# Patient Record
Sex: Female | Born: 1980 | Race: Black or African American | Hispanic: No | Marital: Single | Smoking: Current every day smoker
Health system: Southern US, Community
[De-identification: ages and names within clinical notes are randomized; demographics above are authoritative.]

## PROBLEM LIST (undated history)

## (undated) DIAGNOSIS — R7303 Prediabetes: Secondary | ICD-10-CM

## (undated) DIAGNOSIS — D649 Anemia, unspecified: Secondary | ICD-10-CM

## (undated) DIAGNOSIS — D509 Iron deficiency anemia, unspecified: Secondary | ICD-10-CM

## (undated) DIAGNOSIS — I10 Essential (primary) hypertension: Secondary | ICD-10-CM

## (undated) DIAGNOSIS — J45909 Unspecified asthma, uncomplicated: Secondary | ICD-10-CM

---

## 1999-06-14 HISTORY — PX: ECTOPIC PREGNANCY SURGERY: SHX613

## 2019-03-19 ENCOUNTER — Ambulatory Visit: Payer: Self-pay

## 2019-03-22 ENCOUNTER — Ambulatory Visit: Payer: Self-pay

## 2019-04-25 ENCOUNTER — Encounter: Payer: Self-pay | Admitting: Emergency Medicine

## 2019-04-25 ENCOUNTER — Emergency Department: Payer: Medicaid - Out of State

## 2019-04-25 ENCOUNTER — Other Ambulatory Visit: Payer: Self-pay

## 2019-04-25 ENCOUNTER — Emergency Department
Admission: EM | Admit: 2019-04-25 | Discharge: 2019-04-25 | Disposition: A | Discharge status: Home / Self Care | Payer: Medicaid - Out of State | Attending: Emergency Medicine | Admitting: Emergency Medicine

## 2019-04-25 DIAGNOSIS — I10 Essential (primary) hypertension: Secondary | ICD-10-CM | POA: Diagnosis not present

## 2019-04-25 DIAGNOSIS — J45901 Unspecified asthma with (acute) exacerbation: Secondary | ICD-10-CM | POA: Insufficient documentation

## 2019-04-25 DIAGNOSIS — R079 Chest pain, unspecified: Secondary | ICD-10-CM | POA: Diagnosis present

## 2019-04-25 HISTORY — DX: Essential (primary) hypertension: I10

## 2019-04-25 LAB — CBC
HCT: 33.8 % — ABNORMAL LOW (ref 36.0–46.0)
Hemoglobin: 10.7 g/dL — ABNORMAL LOW (ref 12.0–15.0)
MCH: 24.9 pg — ABNORMAL LOW (ref 26.0–34.0)
MCHC: 31.7 g/dL (ref 30.0–36.0)
MCV: 78.8 fL — ABNORMAL LOW (ref 80.0–100.0)
Platelets: 241 10*3/uL (ref 150–400)
RBC: 4.29 MIL/uL (ref 3.87–5.11)
RDW: 18.2 % — ABNORMAL HIGH (ref 11.5–15.5)
WBC: 6.4 10*3/uL (ref 4.0–10.5)
nRBC: 0 % (ref 0.0–0.2)

## 2019-04-25 LAB — BASIC METABOLIC PANEL
Anion gap: 10 (ref 5–15)
BUN: 9 mg/dL (ref 6–20)
CO2: 21 mmol/L — ABNORMAL LOW (ref 22–32)
Calcium: 9.2 mg/dL (ref 8.9–10.3)
Chloride: 106 mmol/L (ref 98–111)
Creatinine, Ser: 0.54 mg/dL (ref 0.44–1.00)
GFR calc Af Amer: >60 mL/min (ref >60)
GFR calc non Af Amer: >60 mL/min (ref >60)
Glucose, Bld: 94 mg/dL (ref 70–99)
Potassium: 3.9 mmol/L (ref 3.5–5.1)
Sodium: 137 mmol/L (ref 135–145)

## 2019-04-25 LAB — TROPONIN I (HIGH SENSITIVITY)
Troponin I (High Sensitivity): 3 ng/L (ref <18)
Troponin I (High Sensitivity): 3 ng/L (ref <18)

## 2019-04-25 MED ORDER — ALBUTEROL SULFATE HFA 108 (90 BASE) MCG/ACT IN AERS: 2.0000 | INHALATION_SPRAY | Freq: Four times a day (QID) | RESPIRATORY_TRACT | 2 refills | Status: DC | PRN

## 2019-04-25 MED ORDER — PREDNISONE 20 MG PO TABS: 40.0000 mg | ORAL_TABLET | Freq: Every day | ORAL | 0 refills | Status: DC

## 2019-04-25 MED ORDER — ASPIRIN 81 MG PO CHEW
324.0000 mg | CHEWABLE_TABLET | Freq: Once | ORAL | Status: AC
  Administered 2019-04-25: 324 mg via ORAL
  Filled 2019-04-25: qty 4

## 2019-04-25 MED ORDER — PREDNISONE 20 MG PO TABS
40.0000 mg | ORAL_TABLET | Freq: Once | ORAL | Status: AC
  Administered 2019-04-25: 40 mg via ORAL
  Filled 2019-04-25: qty 2

## 2019-04-25 MED ORDER — IPRATROPIUM-ALBUTEROL 0.5-2.5 (3) MG/3ML IN SOLN
3.0000 mL | Freq: Once | RESPIRATORY_TRACT | Status: AC
  Administered 2019-04-25: 3 mL via RESPIRATORY_TRACT
  Filled 2019-04-25: qty 3

## 2019-04-25 NOTE — Discharge Instructions (Signed)
We believe that your symptoms are caused today by an exacerbation of your asthma.  Please take the prescribed medications and any medications that you have at home.  Follow up with your doctor as recommended.  If you develop any new or worsening symptoms, including but not limited to fever, persistent vomiting, worsening shortness of breath, or other symptoms that concern you, please return to the Emergency Department immediately.  

## 2019-04-25 NOTE — ED Provider Notes (Signed)
[facility] Emergency Department Provider Note   ____________________________________________   First MD Initiated Contact with Patient 04/25/19 1006     (approximate)  I have reviewed the triage vital signs and the nursing notes.   HISTORY  Chief Complaint Chest Pain and Shortness of Breath    HPI Margaret Newman is a 38 y.o. female is a history of asthma as well as hypertension  Patient reports that last night she began experiencing a slight wheeze or tight feeling in her chest.  She felt like it was her asthma.  She used her inhaler and this provided relief.  This morning when she woke up again she was again experiencing a feeling of tightness in the chest.  Used her inhaler and this again provided relief, but she was experiencing a tight feeling as well prompting her to come for evaluation.  Denies history of heart disease.  Has hypertension.  She does smoke.  No history of blood clots.  No recent long travels, leg swelling, recent surgeries or trauma.  Overall feeling better, just slight feeling of tightness in the chest.    Past Medical History:  Diagnosis Date  . Hypertension     There are no active problems to display for this patient.   History reviewed. No pertinent surgical history.  Prior to Admission medications   Medication Sig Start Date End Date Taking? Authorizing Provider  albuterol (VENTOLIN HFA) 108 (90 Base) MCG/ACT inhaler Inhale 2 puffs into the lungs every 6 (six) hours as needed for wheezing or shortness of breath. 04/25/19   Delman Kitten, MD  predniSONE (DELTASONE) 20 MG tablet Take 2 tablets (40 mg total) by mouth daily with breakfast. 04/25/19   Delman Kitten, MD    Allergies Patient has no allergy information on record.  No family history on file.  Social History Social History   Tobacco Use  . Smoking status: Not on file  Substance Use Topics  . Alcohol use: Not on file  . Drug use: Not on file  Smoker,  occasional marijuana use.  No alcohol use  Review of Systems Constitutional: No fever/chills Eyes: No visual changes. ENT: No sore throat. Cardiovascular: Denies chest pain.  Ports a tightness feeling in the chest very light, better now but still some persistence left chest.  Nonradiating. Respiratory: Denies shortness of breath except had a little bit last night relieved by use of her inhaler and also some this morning again relieved by use of inhaler. Gastrointestinal: No abdominal pain.   Genitourinary: Negative for dysuria. Musculoskeletal: Negative for back pain. Skin: Negative for rash. Neurological: Negative for headaches, areas of focal weakness or numbness.    ____________________________________________   PHYSICAL EXAM:  VITAL SIGNS: ED Triage Vitals  Enc Vitals Group     BP 04/25/19 0941 (!) 160/103     Pulse Rate 04/25/19 0941 72     Resp 04/25/19 0941 20     Temp 04/25/19 0941 98.6 F (37 C)     Temp Source 04/25/19 0941 Oral     SpO2 04/25/19 0941 97 %     Weight 04/25/19 0939 190 lb (86.2 kg)     Height 04/25/19 0939 5\' 2"  (1.575 m)     Head Circumference --      Peak Flow --      Pain Score 04/25/19 0939 6     Pain Loc --      Pain Edu? --      Excl. in [place]? --  Constitutional: Alert and oriented. Well appearing and in no acute distress. Eyes: Conjunctivae are normal. Head: Atraumatic. Nose: No congestion/rhinnorhea. Mouth/Throat: Mucous membranes are moist. Neck: No stridor.  Cardiovascular: Normal rate, regular rhythm. Grossly normal heart sounds.  Good peripheral circulation. Respiratory: Normal respiratory effort.  No retractions. Lungs CTAB for very minimal slight end expiratory wheeze but speaking in full and clear sentences with normal respiratory pattern. Gastrointestinal: Soft and nontender. No distention. Musculoskeletal: No lower extremity tenderness nor edema. Neurologic:  Normal speech and language. No gross focal neurologic deficits  are appreciated.  Skin:  Skin is warm, dry and intact. No rash noted. Psychiatric: Mood and affect are normal. Speech and behavior are normal.  ____________________________________________   LABS (all labs ordered are listed, but only abnormal results are displayed)  Labs Reviewed  BASIC METABOLIC PANEL - Abnormal; Notable for the following components:      Result Value   CO2 21 (*)    All other components within normal limits  CBC - Abnormal; Notable for the following components:   Hemoglobin 10.7 (*)    HCT 33.8 (*)    MCV 78.8 (*)    MCH 24.9 (*)    RDW 18.2 (*)    All other components within normal limits  TROPONIN I (HIGH SENSITIVITY)  TROPONIN I (HIGH SENSITIVITY)   ____________________________________________  EKG  Reviewed enterotomy at 950 Heart rate 70 QRS 110 QTc 430 Normal sinus rhythm, no evidence of ischemia or ectopy.  Normal EKG ____________________________________________  RADIOLOGY  Chest x-ray viewed by me, normal exam ____________________________________________   PROCEDURES  Procedure(s) performed: None  Procedures  Critical Care performed: No  ____________________________________________   INITIAL IMPRESSION / ASSESSMENT AND PLAN / ED COURSE  Pertinent labs & imaging results that were available during my care of the patient were reviewed by me and considered in my medical decision making (see chart for details).   Differential diagnosis includes, but is not limited to, ACS, aortic dissection, pulmonary embolism, cardiac tamponade, pneumothorax, pneumonia, pericarditis, myocarditis, GI-related causes including esophagitis/gastritis, and musculoskeletal chest wall pain.  Patient does not report clinical symptoms of infection.  I suspect her symptoms of chest tightness are most likely related to asthma exacerbation which at the present time appears to be mild, but she has reported responding well to use of her home albuterol.  She is  currently on treatment for hypertension.  She is sinus shows no signs of heart failure.  Felt low risk for ACS based on clinical history and exam, minimal risk factors.  Will check heart score   No signs or symptoms of DVT PE or dissection.      Pulmonary Embolism Rule-out Criteria (PERC rule)                        If YES to ANY of the following, the PERC rule is not satisfied and cannot be used to rule out PE in this patient (consider d-dimer or imaging depending on pre-test probability).                      If NO to ALL of the following, AND the clinician's pre-test probability is <15%, the [facility] rule is satisfied and there is no need for further workup (including no need to obtain a d-dimer) as the post-test probability of pulmonary embolism is <2%.  Mnemonic is HAD CLOTS   H - hormone use (exogenous estrogen)      No. A - age > 50                                                 No. D - DVT/PE history                                      No.   C - coughing blood (hemoptysis)                 No. L - leg swelling, unilateral                             No. O - O2 Sat on Room Air < 95%                  No. T - tachycardia (HR ? 100)                         No. S - surgery or trauma, recent                      No.   Based on my evaluation of the patient, including application of this decision instrument, further testing to evaluate for pulmonary embolism is not indicated at this time.     Clinical Course as of Apr 24 1254  Thu Apr 25, 2019  1117 Hemoglobin noted to be low, low MCV.  Patient denies any acute signs or symptoms suggest bleeding or hemorrhage.   [MQ]  1118 Last menstrual cycle was a couple weeks ago, normal.  Denies pregnancy.   [MQ]    Clinical Course User Index [MQ] Delman Kitten, MD    HEART score 2, low risk  ----------------------------------------- 12:55 PM on 04/25/2019 -----------------------------------------  Patient reports he  feels much better.  Resting comfortably.  Suspect likely mild asthma.  Currently asymptomatic well-appearing.  Return precautions and treatment recommendations and follow-up discussed with the patient who is agreeable with the plan.  ____________________________________________   FINAL CLINICAL IMPRESSION(S) / ED DIAGNOSES  Final diagnoses:  Mild asthma exacerbation        Note:  This document was prepared using Dragon voice recognition software and may include unintentional dictation errors       Delman Kitten, MD 04/25/19 1256

## 2019-04-25 NOTE — ED Notes (Signed)
Pt states that last night she was woken up out of her sleep with an asthma attack. States that she took her inhaler and found no relief. States that she woke up this morning with chest tightness and SOB.

## 2019-04-25 NOTE — ED Triage Notes (Signed)
Pt reports awoke this am and couldn't breathe so she used her inhaler for asthma and has had some tightness in her chest since.

## 2019-04-25 NOTE — ED Notes (Signed)
Patient ambulatory to lobby with steady gait and NAD noted. Verbalized understanding of discharge instructions and follow-up care.  

## 2019-04-25 NOTE — ED Notes (Signed)
Patient transported to x-ray. ?

## 2019-05-08 ENCOUNTER — Other Ambulatory Visit: Payer: Self-pay

## 2019-05-08 ENCOUNTER — Ambulatory Visit: Payer: Self-pay | Admitting: Physician Assistant

## 2019-05-08 DIAGNOSIS — I10 Essential (primary) hypertension: Secondary | ICD-10-CM

## 2019-05-08 DIAGNOSIS — Z113 Encounter for screening for infections with a predominantly sexual mode of transmission: Secondary | ICD-10-CM

## 2019-05-08 DIAGNOSIS — Z299 Encounter for prophylactic measures, unspecified: Secondary | ICD-10-CM

## 2019-05-08 MED ORDER — METRONIDAZOLE 500 MG PO TABS: 2000.0000 mg | ORAL_TABLET | Freq: Once | ORAL | 0 refills | Status: AC

## 2019-05-08 NOTE — Progress Notes (Signed)
Wet mount reviewed with provider, and is negative today. Pt received Metronidazole 2g per Antoine Primas, PA verbal order. Provider orders completed.Ronny Bacon, RN

## 2019-05-11 ENCOUNTER — Encounter: Payer: Self-pay | Admitting: Physician Assistant

## 2019-05-11 DIAGNOSIS — I10 Essential (primary) hypertension: Secondary | ICD-10-CM | POA: Insufficient documentation

## 2019-05-11 NOTE — Progress Notes (Signed)
STI clinic/screening visit  Subjective:  Margaret Newman is a 38 y.o. female being seen today for an STI screening visit. The patient reports they do have symptoms.    Patient has the following medical conditions:  There are no active problems to display for this patient.    Chief Complaint  Patient presents with  . SEXUALLY TRANSMITTED DISEASE    HPI  Patient reports that she has had a "fishy" odor for about 1 week and some cramping after urination.  Denies other symptoms.  LMP 04/27/2019 and normal.  History of HTN and surgery for ectopic pregnancy 19 years ago.  See flowsheet for further details and programmatic requirements.    The following portions of the patient's history were reviewed and updated as appropriate: allergies, current medications, past medical history, past social history, past surgical history and problem list.  Objective:  There were no vitals filed for this visit.  Physical Exam Constitutional:      General: She is not in acute distress.    Appearance: Normal appearance.  HENT:     Head: Normocephalic and atraumatic.     Comments: No nits, lice, or hair loss. No cervical, supraclavicular or axillary adenopathy.    Mouth/Throat:     Mouth: Mucous membranes are moist.     Pharynx: Oropharynx is clear. No oropharyngeal exudate or posterior oropharyngeal erythema.  Eyes:     Conjunctiva/sclera: Conjunctivae normal.  Neck:     Musculoskeletal: Neck supple.  Pulmonary:     Effort: Pulmonary effort is normal.  Abdominal:     Palpations: Abdomen is soft. There is no mass.     Tenderness: There is no abdominal tenderness. There is no guarding or rebound.  Genitourinary:    General: Normal vulva.     Rectum: Normal.     Comments: External genitalia/pubic area without nits, lice, edema, erythema, and inguinal adenopathy. Vagina with normal mucosa and small amount of clear discharge, pH=4.5. Cervix without visible lesions. Uterus firm, mobile,  nt, no masses, no adnexal tenderness or fullness, no CMT. Lymphadenopathy:     Cervical: No cervical adenopathy.  Skin:    General: Skin is warm and dry.     Findings: No bruising, erythema, lesion or rash.  Neurological:     Mental Status: She is alert and oriented to person, place, and time.  Psychiatric:        Mood and Affect: Mood normal.        Behavior: Behavior normal.        Thought Content: Thought content normal.        Judgment: Judgment normal.       Assessment and Plan:  Margaret Newman is a 38 y.o. female presenting to the [facility] Department for STI screening  1. Screening for STD (sexually transmitted disease) Patient into clinic with symptoms.   Rec condoms with all sex. Await test results.  Counseled that RN will call if needs to RTC for further treatment once results are back. - WET PREP FOR TRICH, YEAST, CLUE - Gonococcus culture - Chlamydia/Gonorrhea [place] [facility] - HIV/HCV [place] [facility] - Syphilis Serology, [place] [facility] - Gonococcus culture  2. Prophylactic measure Will give metronidazole 2 g po with food, no EtOH for 24 hr before and until 72 hr after taking medicine due to symptoms. No sex for 7 days. Enc to use OTC antifungal cream if has itching after taking antibiotic. - metroNIDAZOLE (FLAGYL) 500 MG tablet; Take 4 tablets (2,000  mg total) by mouth once for 1 dose.  Dispense: 4 tablet; Refill: 0     Return in about 3 weeks (around 05/29/2019) for test results.  No future appointments.  Jerene Dilling, PA

## 2019-05-13 LAB — GONOCOCCUS CULTURE

## 2019-05-17 LAB — HM HEPATITIS C SCREENING LAB: HM Hepatitis Screen: NEGATIVE

## 2019-05-17 LAB — HM HIV SCREENING LAB: HM HIV Screening: NEGATIVE

## 2019-05-22 LAB — WET PREP FOR TRICH, YEAST, CLUE
Trichomonas Exam: NEGATIVE
Yeast Exam: NEGATIVE

## 2019-06-09 ENCOUNTER — Other Ambulatory Visit: Payer: Self-pay

## 2019-06-09 ENCOUNTER — Encounter: Payer: Self-pay | Admitting: Emergency Medicine

## 2019-06-09 ENCOUNTER — Emergency Department
Admission: EM | Admit: 2019-06-09 | Discharge: 2019-06-09 | Disposition: A | Discharge status: Home / Self Care | Payer: PRIVATE HEALTH INSURANCE | Attending: Emergency Medicine | Admitting: Emergency Medicine

## 2019-06-09 DIAGNOSIS — L0291 Cutaneous abscess, unspecified: Secondary | ICD-10-CM

## 2019-06-09 DIAGNOSIS — L0201 Cutaneous abscess of face: Secondary | ICD-10-CM | POA: Insufficient documentation

## 2019-06-09 DIAGNOSIS — Z79899 Other long term (current) drug therapy: Secondary | ICD-10-CM | POA: Insufficient documentation

## 2019-06-09 DIAGNOSIS — F17210 Nicotine dependence, cigarettes, uncomplicated: Secondary | ICD-10-CM | POA: Insufficient documentation

## 2019-06-09 DIAGNOSIS — I10 Essential (primary) hypertension: Secondary | ICD-10-CM | POA: Insufficient documentation

## 2019-06-09 MED ORDER — LIDOCAINE-EPINEPHRINE-TETRACAINE (LET) TOPICAL GEL
3.0000 mL | Freq: Once | TOPICAL | Status: AC
  Administered 2019-06-09: 3 mL via TOPICAL

## 2019-06-09 MED ORDER — CEPHALEXIN 500 MG PO CAPS: 500.0000 mg | ORAL_CAPSULE | Freq: Three times a day (TID) | ORAL | 0 refills | Status: AC

## 2019-06-09 NOTE — ED Triage Notes (Signed)
abscess to left temple x 2 weeks.

## 2019-06-09 NOTE — ED Notes (Signed)
Pt presents to the ED for a small abcess, approx the size of a marble, on her L eyebrow. Pt states that it is not painful anymore and she has been trying to apply warm compresses on it to help drain it.

## 2019-06-09 NOTE — ED Provider Notes (Signed)
[facility] Emergency Department Provider Note  ____________________________________________   First MD Initiated Contact with Patient 06/09/19 1003     (approximate)  I have reviewed the triage vital signs and the nursing notes.   HISTORY  Chief Complaint Abscess    HPI Margaret Newman is a 38 y.o. female presents emergency department complaint of small abscess adjacent to the left brow.  States area is been there for approximately 2 weeks.  No fever or chills.  States it is finally come to a head and would like for [place] to open it up.    Past Medical History:  Diagnosis Date  . Hypertension     Patient Active Problem List   Diagnosis Date Noted  . Hypertension 05/11/2019    Past Surgical History:  Procedure Laterality Date  . ECTOPIC PREGNANCY SURGERY  2001    Prior to Admission medications   Medication Sig Start Date End Date Taking? Authorizing Provider  albuterol (VENTOLIN HFA) 108 (90 Base) MCG/ACT inhaler Inhale 2 puffs into the lungs every 6 (six) hours as needed for wheezing or shortness of breath. 04/25/19   Delman Kitten, MD  AMLODIPINE-ATORVASTATIN PO Take by mouth.    [provider]  cephALEXin (KEFLEX) 500 MG capsule Take 1 capsule (500 mg total) by mouth 3 (three) times daily for 10 days. 06/09/19 06/19/19  Anvith Mauriello, Linden Dolin, PA-C  predniSONE (DELTASONE) 20 MG tablet Take 2 tablets (40 mg total) by mouth daily with breakfast. 04/25/19   Delman Kitten, MD    Allergies Patient has no known allergies.  History reviewed. No pertinent family history.  Social History Social History   Tobacco Use  . Smoking status: Current Every Day Smoker  . Smokeless tobacco: Never Used  Substance Use Topics  . Alcohol use: Yes    Comment: occasionally  . Drug use: Not Currently    Types: Marijuana    Comment: used in high school.    Review of Systems  Constitutional: No fever/chills Eyes: No visual changes. ENT: No sore  throat. Respiratory: Denies cough Genitourinary: Negative for dysuria. Musculoskeletal: Negative for back pain. Skin: Negative for rash.  Positive for cystlike abscess on the left temple    ____________________________________________   PHYSICAL EXAM:  VITAL SIGNS: ED Triage Vitals  Enc Vitals Group     BP 06/09/19 0926 (!) 156/92     Pulse Rate 06/09/19 0926 79     Resp 06/09/19 0926 16     Temp 06/09/19 0926 98.4 F (36.9 C)     Temp Source 06/09/19 0926 Oral     SpO2 06/09/19 0926 98 %     Weight 06/09/19 0923 190 lb 0.6 oz (86.2 kg)     Height --      Head Circumference --      Peak Flow --      Pain Score 06/09/19 0922 9     Pain Loc --      Pain Edu? --      Excl. in [place]? --     Constitutional: Alert and oriented. Well appearing and in no acute distress. Eyes: Conjunctivae are normal.  Head: Atraumatic.  Cystlike abscess to the left temple Nose: No congestion/rhinnorhea. Mouth/Throat: Mucous membranes are moist.   Neck:  supple no lymphadenopathy noted Cardiovascular: Normal rate, regular rhythm. Respiratory: Normal respiratory effort.  No retractions,  GU: deferred Musculoskeletal: FROM all extremities, warm and well perfused Neurologic:  Normal speech and language.  Skin:  Skin is warm, dry  and intact. No rash noted.  Small cystlike abscess noted to left temple Psychiatric: Mood and affect are normal. Speech and behavior are normal.  ____________________________________________   LABS (all labs ordered are listed, but only abnormal results are displayed)  Labs Reviewed - No data to display ____________________________________________   ____________________________________________  RADIOLOGY    ____________________________________________   PROCEDURES  Procedure(s) performed: Permission for procedure by the patient verbally, L ET applied to the abscess, Betadine to clean the area, number 18-gauge needle used to make a small incision, pus was  expelled, patient tolerated procedure well  Procedures    ____________________________________________   INITIAL IMPRESSION / ASSESSMENT AND PLAN / ED COURSE  Pertinent labs & imaging results that were available during my care of the patient were reviewed by me and considered in my medical decision making (see chart for details).   Patient is 38 year old female presents emergency department with concerns of an abscess to the left temple.  See HPI  Physical exam shows small cystlike abscess noted.  See procedure note for incision/drainage  Patient was given a prescription for Keflex 500 3 times daily for 7 days.  Follow-up with your regular doctor return emergency department worsening.  She is discharged in stable condition.    Margaret Newman was evaluated in Emergency Department on 06/09/2019 for the symptoms described in the history of present illness. She was evaluated in the context of the global COVID-19 pandemic, which necessitated consideration that the patient might be at risk for infection with the SARS-CoV-2 virus that causes COVID-19. Institutional protocols and algorithms that pertain to the evaluation of patients at risk for COVID-19 are in a state of rapid change based on information released by regulatory bodies including the CDC and federal and state organizations. These policies and algorithms were followed during the patient's care in the ED.   As part of my medical decision making, I reviewed the following data within the [place] notes reviewed and incorporated, Old chart reviewed, Notes from prior ED visits and [place] Controlled Substance Database  ____________________________________________   FINAL CLINICAL IMPRESSION(S) / ED DIAGNOSES  Final diagnoses:  Abscess      NEW MEDICATIONS STARTED DURING THIS VISIT:  New Prescriptions   CEPHALEXIN (KEFLEX) 500 MG CAPSULE    Take 1 capsule (500 mg total) by mouth 3 (three) times daily for  10 days.     Note:  This document was prepared using Dragon voice recognition software and may include unintentional dictation errors.    Versie Starks, PA-C 06/09/19 1057    Harvest Dark, MD 06/09/19 1345

## 2019-07-12 ENCOUNTER — Ambulatory Visit: Payer: Self-pay

## 2019-08-24 ENCOUNTER — Other Ambulatory Visit: Payer: Self-pay

## 2019-08-24 ENCOUNTER — Encounter: Payer: Self-pay | Admitting: Emergency Medicine

## 2019-08-24 ENCOUNTER — Emergency Department
Admission: EM | Admit: 2019-08-24 | Discharge: 2019-08-24 | Disposition: A | Discharge status: Home / Self Care | Payer: PRIVATE HEALTH INSURANCE | Attending: Emergency Medicine | Admitting: Emergency Medicine

## 2019-08-24 DIAGNOSIS — B9689 Other specified bacterial agents as the cause of diseases classified elsewhere: Secondary | ICD-10-CM

## 2019-08-24 DIAGNOSIS — R1030 Lower abdominal pain, unspecified: Secondary | ICD-10-CM

## 2019-08-24 DIAGNOSIS — I10 Essential (primary) hypertension: Secondary | ICD-10-CM | POA: Insufficient documentation

## 2019-08-24 DIAGNOSIS — N760 Acute vaginitis: Secondary | ICD-10-CM | POA: Insufficient documentation

## 2019-08-24 DIAGNOSIS — F17200 Nicotine dependence, unspecified, uncomplicated: Secondary | ICD-10-CM | POA: Insufficient documentation

## 2019-08-24 LAB — URINALYSIS, COMPLETE (UACMP) WITH MICROSCOPIC
Bacteria, UA: NONE SEEN
Bilirubin Urine: NEGATIVE
Glucose, UA: NEGATIVE mg/dL
Hgb urine dipstick: NEGATIVE
Ketones, ur: NEGATIVE mg/dL
Leukocytes,Ua: NEGATIVE
Nitrite: NEGATIVE
Protein, ur: NEGATIVE mg/dL
Specific Gravity, Urine: 1.016 (ref 1.005–1.030)
pH: 6 (ref 5.0–8.0)

## 2019-08-24 LAB — WET PREP, GENITAL
Clue Cells Wet Prep HPF POC: NONE SEEN
Sperm: NONE SEEN
Trich, Wet Prep: NONE SEEN
Yeast Wet Prep HPF POC: NONE SEEN

## 2019-08-24 LAB — CHLAMYDIA/NGC RT PCR (ARMC ONLY)
Chlamydia Tr: NOT DETECTED
N gonorrhoeae: NOT DETECTED

## 2019-08-24 LAB — POCT PREGNANCY, URINE: Preg Test, Ur: NEGATIVE

## 2019-08-24 MED ORDER — METRONIDAZOLE 500 MG PO TABS: 500.0000 mg | ORAL_TABLET | Freq: Two times a day (BID) | ORAL | 0 refills | Status: DC

## 2019-08-24 MED ORDER — FLUCONAZOLE 150 MG PO TABS: 150.0000 mg | ORAL_TABLET | Freq: Every day | ORAL | 0 refills | Status: DC

## 2019-08-24 NOTE — ED Notes (Signed)
Pt speaking with this RN in NAD, reports RLQ pain that felt like gas starting last week but pain is now center lower abdomen accompanied by a foul vaginal odor and white vaginal discharge. No dysuria. A&Ox4.

## 2019-08-24 NOTE — ED Provider Notes (Signed)
[facility] Emergency Department Provider Note   ____________________________________________    I have reviewed the triage vital signs and the nursing notes.   HISTORY  Chief Complaint Abdominal Pain     HPI Margaret Newman is a 39 y.o. female who presents with complaints of lower abdominal pain.  Patient describes that she had some discomfort in her right lower quadrant which has primarily resolved.  This morning after urinating she had cramping suprapubically which was new.  She denies fevers or chills.  Currently she feels overall well.  Denies nausea or vomiting.  Has not take anything for this.  Past Medical History:  Diagnosis Date  . Hypertension     Patient Active Problem List   Diagnosis Date Noted  . Hypertension 05/11/2019    Past Surgical History:  Procedure Laterality Date  . ECTOPIC PREGNANCY SURGERY  2001    Prior to Admission medications   Medication Sig Start Date End Date Taking? Authorizing Provider  AMLODIPINE-ATORVASTATIN PO Take by mouth.   Yes [provider]  fluconazole (DIFLUCAN) 150 MG tablet Take 1 tablet (150 mg total) by mouth daily. If no improvement after first dose can take 2nd tablet 72 hours later 08/24/19   Lavonia Drafts, MD  metroNIDAZOLE (FLAGYL) 500 MG tablet Take 1 tablet (500 mg total) by mouth 2 (two) times daily after a meal. 08/24/19   Lavonia Drafts, MD     Allergies Patient has no known allergies.  History reviewed. No pertinent family history.  Social History Social History   Tobacco Use  . Smoking status: Current Every Day Smoker  . Smokeless tobacco: Never Used  Substance Use Topics  . Alcohol use: Yes    Comment: occasionally  . Drug use: Not Currently    Types: Marijuana    Comment: used in high school.    Review of Systems  Constitutional: No fever/chills Eyes: No visual changes.  ENT: No sore throat. Cardiovascular: Denies chest pain. Respiratory: Denies  shortness of breath. Gastrointestinal: As above Genitourinary: As above, no vaginal discharge Musculoskeletal: Negative for back pain. Skin: Negative for rash. Neurological: Negative for headaches or weakness   ____________________________________________   PHYSICAL EXAM:  VITAL SIGNS: ED Triage Vitals  Enc Vitals Group     BP 08/24/19 0837 134/85     Pulse Rate 08/24/19 0837 74     Resp 08/24/19 0837 18     Temp 08/24/19 0837 99.2 F (37.3 C)     Temp Source 08/24/19 0837 Oral     SpO2 08/24/19 0837 99 %     Weight 08/24/19 0827 86.2 kg (190 lb)     Height 08/24/19 0827 1.588 m (5' 2.5")     Head Circumference --      Peak Flow --      Pain Score 08/24/19 0827 7     Pain Loc --      Pain Edu? --      Excl. in [place]? --     Constitutional: Alert and oriented.  Eyes: Conjunctivae are normal.   Mouth/Throat: Mucous membranes are moist.   Neck:  Painless ROM Cardiovascular: Normal rate, regular rhythm Good peripheral circulation. Respiratory: Normal respiratory effort.  No retractions. Gastrointestinal: Soft and nontender. No distention.  No CVA tenderness.  Reassuring exam Genitourinary: No cervicitis, white discharge no CMT Musculoskeletal: No lower extremity tenderness nor edema.  Warm and well perfused Neurologic:  Normal speech and language. No gross focal neurologic deficits are appreciated.  Skin:  Skin is warm, dry and intact. No rash noted. Psychiatric: Mood and affect are normal. Speech and behavior are normal.  ____________________________________________   LABS (all labs ordered are listed, but only abnormal results are displayed)  Labs Reviewed  WET PREP, GENITAL - Abnormal; Notable for the following components:      Result Value   WBC, Wet Prep HPF POC FEW (*)    All other components within normal limits  URINALYSIS, COMPLETE (UACMP) WITH MICROSCOPIC - Abnormal; Notable for the following components:   Color, Urine YELLOW (*)    APPearance HAZY (*)      All other components within normal limits  CHLAMYDIA/NGC RT PCR ([facility] ONLY)  POCT PREGNANCY, URINE   ____________________________________________  EKG  None ____________________________________________  RADIOLOGY  None ____________________________________________   PROCEDURES  Procedure(s) performed: No  Procedures   Critical Care performed: No ____________________________________________   INITIAL IMPRESSION / ASSESSMENT AND PLAN / ED COURSE  Pertinent labs & imaging results that were available during my care of the patient were reviewed by me and considered in my medical decision making (see chart for details).  Patient well-appearing with unremarkable exam, suspicious for urinary tract infection given HPI, will check urinalysis, POCT urine  Urinalysis unremarkable, patient does admit to vaginal discharge with fishy odor.  Pelvic exam performed which demonstrates white discharge, sent for wet prep some white blood cells otherwise unremarkable.  Will cover with Flagyl for presumed bacterial vaginosis, outpatient follow-up with GYN if no improvement    ____________________________________________   FINAL CLINICAL IMPRESSION(S) / ED DIAGNOSES  Final diagnoses:  Lower abdominal pain  Bacterial vaginosis        Note:  This document was prepared using Dragon voice recognition software and may include unintentional dictation errors.   Lavonia Drafts, MD 08/24/19 1245

## 2019-08-24 NOTE — ED Triage Notes (Signed)
Pt ambulatory to triage with c/o abdominal pain starting 1 week ago.  Pt denies n/v, states urinary dysfunction.

## 2019-08-24 NOTE — ED Notes (Signed)
Pt also reports right ear feels full and would like this looked at

## 2019-09-19 ENCOUNTER — Other Ambulatory Visit: Payer: Self-pay

## 2019-09-19 ENCOUNTER — Emergency Department
Admission: EM | Admit: 2019-09-19 | Discharge: 2019-09-19 | Disposition: A | Discharge status: Home / Self Care | Payer: Medicaid - Out of State | Attending: Emergency Medicine | Admitting: Emergency Medicine

## 2019-09-19 ENCOUNTER — Emergency Department: Payer: Medicaid - Out of State

## 2019-09-19 ENCOUNTER — Encounter: Payer: Self-pay | Admitting: Emergency Medicine

## 2019-09-19 DIAGNOSIS — Y990 Civilian activity done for income or pay: Secondary | ICD-10-CM | POA: Insufficient documentation

## 2019-09-19 DIAGNOSIS — F17210 Nicotine dependence, cigarettes, uncomplicated: Secondary | ICD-10-CM | POA: Insufficient documentation

## 2019-09-19 DIAGNOSIS — Y9259 Other trade areas as the place of occurrence of the external cause: Secondary | ICD-10-CM | POA: Diagnosis not present

## 2019-09-19 DIAGNOSIS — S3992XA Unspecified injury of lower back, initial encounter: Secondary | ICD-10-CM | POA: Diagnosis present

## 2019-09-19 DIAGNOSIS — Y9389 Activity, other specified: Secondary | ICD-10-CM | POA: Diagnosis not present

## 2019-09-19 DIAGNOSIS — Z79899 Other long term (current) drug therapy: Secondary | ICD-10-CM | POA: Insufficient documentation

## 2019-09-19 DIAGNOSIS — I10 Essential (primary) hypertension: Secondary | ICD-10-CM | POA: Insufficient documentation

## 2019-09-19 DIAGNOSIS — X500XXA Overexertion from strenuous movement or load, initial encounter: Secondary | ICD-10-CM | POA: Diagnosis not present

## 2019-09-19 DIAGNOSIS — S39012A Strain of muscle, fascia and tendon of lower back, initial encounter: Secondary | ICD-10-CM | POA: Insufficient documentation

## 2019-09-19 LAB — URINALYSIS, COMPLETE (UACMP) WITH MICROSCOPIC
Bacteria, UA: NONE SEEN
Bilirubin Urine: NEGATIVE
Glucose, UA: NEGATIVE mg/dL
Ketones, ur: NEGATIVE mg/dL
Leukocytes,Ua: NEGATIVE
Nitrite: NEGATIVE
Protein, ur: NEGATIVE mg/dL
Specific Gravity, Urine: 1.016 (ref 1.005–1.030)
pH: 6 (ref 5.0–8.0)

## 2019-09-19 LAB — POCT PREGNANCY, URINE: Preg Test, Ur: NEGATIVE

## 2019-09-19 MED ORDER — LIDOCAINE 5 % EX PTCH: 1.0000 | MEDICATED_PATCH | CUTANEOUS | 0 refills | Status: DC

## 2019-09-19 MED ORDER — FLUCONAZOLE 100 MG PO TABS: 150.0000 mg | ORAL_TABLET | Freq: Every day | ORAL | 0 refills | Status: AC

## 2019-09-19 MED ORDER — IBUPROFEN 400 MG PO TABS: 400.0000 mg | ORAL_TABLET | Freq: Four times a day (QID) | ORAL | 0 refills | Status: DC | PRN

## 2019-09-19 MED ORDER — BACLOFEN 5 MG PO TABS: 5.0000 mg | ORAL_TABLET | Freq: Three times a day (TID) | ORAL | 0 refills | Status: DC | PRN

## 2019-09-19 NOTE — ED Notes (Signed)
See triage note  Presents with lower back pain  States bent down at work to lift something  Developed pain to left lower back  States this happened yesterday

## 2019-09-19 NOTE — ED Notes (Signed)
EDT in to complete required WC testing per company profile, pt refused to have testing completed. RN aware.

## 2019-09-19 NOTE — ED Triage Notes (Signed)
Pt reports pulled a muscle in her left lower back yesterday. Pt works for [employer] reports pain still there this am.

## 2019-09-19 NOTE — ED Provider Notes (Signed)
[facility] Emergency Department Provider Note  ____________________________________________  Time seen: Approximately 11:44 AM  I have reviewed the triage vital signs and the nursing notes.   HISTORY  Chief Complaint Back Pain    HPI Margaret Newman is a 39 y.o. female that presents to the emergency department for evaluation of left-sided mid back pain since yesterday.  Pain started while patient was at work and she was moving heavy machinery.  She did not have any specific injury doing this.  She did not feel a pop.  Pain has stayed to her left mid back since.  She woke up this morning and pain was still present so she came to the emergency department.  She noticed after arriving to the emergency department that her urine looked cloudy and she had a drop of blood when she wiped.  She is not sure of blood is from her urine or was spotting.  Last menstrual period was 2 weeks ago.  She has never had a kidney stone.  Patient used a new [employer] soap, [place]" and states that it has irritated her genitals a bit.  No fever, chills, nausea, vomiting, abdominal pain.   Past Medical History:  Diagnosis Date  . Hypertension     Patient Active Problem List   Diagnosis Date Noted  . Hypertension 05/11/2019    Past Surgical History:  Procedure Laterality Date  . ECTOPIC PREGNANCY SURGERY  2001    Prior to Admission medications   Medication Sig Start Date End Date Taking? Authorizing Provider  AMLODIPINE-ATORVASTATIN PO Take by mouth.    [provider]  Baclofen 5 MG TABS Take 5 mg by mouth 3 (three) times daily as needed. 09/19/19   Laban Emperor, PA-C  fluconazole (DIFLUCAN) 100 MG tablet Take 1.5 tablets (150 mg total) by mouth daily for 1 day. 09/19/19 09/20/19  Laban Emperor, PA-C  ibuprofen (ADVIL) 400 MG tablet Take 1 tablet (400 mg total) by mouth every 6 (six) hours as needed. 09/19/19   Laban Emperor, PA-C  lidocaine  (LIDODERM) 5 % Place 1 patch onto the skin daily. Remove & Discard patch within 12 hours or as directed by MD 09/19/19   Laban Emperor, PA-C    Allergies Patient has no known allergies.  No family history on file.  Social History Social History   Tobacco Use  . Smoking status: Current Every Day Smoker  . Smokeless tobacco: Never Used  Substance Use Topics  . Alcohol use: Yes    Comment: occasionally  . Drug use: Not Currently    Types: Marijuana    Comment: used in high school.     Review of Systems  Constitutional: No fever/chills Cardiovascular: No chest pain. Respiratory: No SOB. Gastrointestinal: No abdominal pain.  No nausea, no vomiting.  Genitourinary: Negative for dysuria.   Musculoskeletal: Positive for back pain. Skin: Negative for rash, abrasions, lacerations, ecchymosis. Neurological: Negative for headaches   ____________________________________________   PHYSICAL EXAM:  VITAL SIGNS: ED Triage Vitals  Enc Vitals Group     BP 09/19/19 1044 130/80     Pulse Rate 09/19/19 1044 78     Resp 09/19/19 1044 18     Temp 09/19/19 1044 98 F (36.7 C)     Temp Source 09/19/19 1044 Oral     SpO2 09/19/19 1044 98 %     Weight 09/19/19 1033 185 lb (83.9 kg)     Height 09/19/19 1033 5\' 2"  (1.575 m)  Head Circumference --      Peak Flow --      Pain Score 09/19/19 1032 7     Pain Loc --      Pain Edu? --      Excl. in [place]? --      Constitutional: Alert and oriented. Well appearing and in no acute distress.  On the telephone. Eyes: Conjunctivae are normal. PERRL. EOMI. Head: Atraumatic. ENT:      Ears:      Nose: No congestion/rhinnorhea.      Mouth/Throat: Mucous membranes are moist.  Neck: No stridor. Cardiovascular: Normal rate, regular rhythm.  Good peripheral circulation. Respiratory: Normal respiratory effort without tachypnea or retractions. Lungs CTAB. Good air entry to the bases with no decreased or absent breath sounds. Gastrointestinal:  Bowel sounds 4 quadrants. Soft and nontender to palpation. No guarding or rigidity. No palpable masses. No distention. No CVA tenderness. Musculoskeletal: Full range of motion to all extremities. No gross deformities appreciated.  No tenderness to palpation over lumbar spine.  Tenderness to palpation to left mid back and left lumbar paraspinal muscles.  Strength equal in lower extremities bilaterally.  Normal gait. Neurologic:  Normal speech and language. No gross focal neurologic deficits are appreciated.  Skin:  Skin is warm, dry and intact. No rash noted. Psychiatric: Mood and affect are normal. Speech and behavior are normal. Patient exhibits appropriate insight and judgement.   ____________________________________________   LABS (all labs ordered are listed, but only abnormal results are displayed)  Labs Reviewed  URINALYSIS, COMPLETE (UACMP) WITH MICROSCOPIC - Abnormal; Notable for the following components:      Result Value   Color, Urine YELLOW (*)    APPearance CLEAR (*)    Hgb urine dipstick MODERATE (*)    All other components within normal limits  POC URINE PREG, ED  POCT PREGNANCY, URINE   ____________________________________________  EKG   ____________________________________________  RADIOLOGY   CT Renal Stone Study  Result Date: 09/19/2019 CLINICAL DATA:  Presents with lower back pain States bent down at work to lift something Developed pain to left lower back States this happened yesterday EXAM: CT ABDOMEN AND PELVIS WITHOUT CONTRAST TECHNIQUE: Multidetector CT imaging of the abdomen and pelvis was performed following the standard protocol without IV contrast. COMPARISON:  None. FINDINGS: Lower chest: Clear lung bases.  Heart normal in size. Hepatobiliary: No focal liver abnormality is seen. No gallstones, gallbladder wall thickening, or biliary dilatation. Pancreas: Unremarkable. No pancreatic ductal dilatation or surrounding inflammatory changes. Spleen: Normal in  size without focal abnormality. Adrenals/Urinary Tract: No adrenal masses. Kidneys normal in size, orientation and position. No masses. No convincing intrarenal stones and no hydronephrosis. Normal ureters. Bladder wall appears prominent, but bladder is only mildly distended. No bladder mass or stone. Stomach/Bowel: Stomach is within normal limits. Appendix appears normal. No evidence of bowel wall thickening, distention, or inflammatory changes. Vascular/Lymphatic: No aneurysm. Mild aortic atherosclerotic calcifications. No enlarged lymph nodes. Reproductive: Uterus and bilateral adnexa are unremarkable. Other: Small fat containing umbilical hernia.  No ascites. Musculoskeletal: No acute or significant osseous findings. IMPRESSION: 1. No acute findings within the abdomen or pelvis. Findings to account for the patient's symptoms. 2. Mild aortic atherosclerotic calcifications. No other abnormalities. Electronically Signed   By: Lajean Manes M.D.   On: 09/19/2019 12:04    ____________________________________________    PROCEDURES  Procedure(s) performed:    Procedures    Medications - No data to display   ____________________________________________   INITIAL IMPRESSION /  ASSESSMENT AND PLAN / ED COURSE  Pertinent labs & imaging results that were available during my care of the patient were reviewed by me and considered in my medical decision making (see chart for details).  Review of the  [place]RS was performed in accordance of the [place] prior to dispensing any controlled drugs.   Patient presented to the emergency department for evaluation of mid back pain since yesterday.  Vital signs and exam are reassuring.  There is hemoglobin on urinalysis.  Patient urgently could not clarify whether the spotting she saw on the toilet paper this morning was vaginal spotting or spotting in her urine.  CT scan was ordered to evaluate for nephrolithiasis.  CT scan is negative for acute abnormalities.   Patient is unable to clarify that she does have some vaginal spotting.  Patient will be discharged home with prescriptions for baclofen and Motrin.  Patient is very concerned that the baclofen will give her a yeast infection.  We discussed that baclofen is very unlikely to cause a yeast infection.  Patient is still requesting a prescription for Diflucan that she states that she will not fill or pick up unless she does develop a yeast infection.  Patient is to follow up with primary care as directed. Patient is given ED precautions to return to the ED for any worsening or new symptoms.   Margaret Newman was evaluated in Emergency Department on 09/19/2019 for the symptoms described in the history of present illness. She was evaluated in the context of the global COVID-19 pandemic, which necessitated consideration that the patient might be at risk for infection with the SARS-CoV-2 virus that causes COVID-19. Institutional protocols and algorithms that pertain to the evaluation of patients at risk for COVID-19 are in a state of rapid change based on information released by regulatory bodies including the CDC and federal and state organizations. These policies and algorithms were followed during the patient's care in the ED.  ____________________________________________  FINAL CLINICAL IMPRESSION(S) / ED DIAGNOSES  Final diagnoses:  Strain of lumbar region, initial encounter      NEW MEDICATIONS STARTED DURING THIS VISIT:  ED Discharge Orders         Ordered    lidocaine (LIDODERM) 5 %  Every 24 hours     09/19/19 1337    Baclofen 5 MG TABS  3 times daily PRN     09/19/19 1337    ibuprofen (ADVIL) 400 MG tablet  Every 6 hours PRN     09/19/19 1337    fluconazole (DIFLUCAN) 100 MG tablet  Daily     09/19/19 1337              This chart was dictated using voice recognition software/Dragon. Despite best efforts to proofread, errors can occur which can change the meaning. Any change was  purely unintentional.    Laban Emperor, PA-C 09/19/19 1444    Carrie Mew, MD 09/19/19 [phone]

## 2019-12-26 ENCOUNTER — Other Ambulatory Visit: Payer: Self-pay

## 2019-12-26 ENCOUNTER — Encounter: Payer: Self-pay | Admitting: Gerontology

## 2019-12-26 ENCOUNTER — Ambulatory Visit: Payer: Self-pay | Admitting: Gerontology

## 2019-12-26 VITALS — BP 167/107 | HR 74 | Ht 62.5 in | Wt 190.0 lb

## 2019-12-26 DIAGNOSIS — H9211 Otorrhea, right ear: Secondary | ICD-10-CM | POA: Insufficient documentation

## 2019-12-26 DIAGNOSIS — I10 Essential (primary) hypertension: Secondary | ICD-10-CM

## 2019-12-26 DIAGNOSIS — H6121 Impacted cerumen, right ear: Secondary | ICD-10-CM | POA: Insufficient documentation

## 2019-12-26 DIAGNOSIS — Z8669 Personal history of other diseases of the nervous system and sense organs: Secondary | ICD-10-CM | POA: Insufficient documentation

## 2019-12-26 DIAGNOSIS — R0683 Snoring: Secondary | ICD-10-CM | POA: Insufficient documentation

## 2019-12-26 DIAGNOSIS — Z7689 Persons encountering health services in other specified circumstances: Secondary | ICD-10-CM

## 2019-12-26 HISTORY — DX: Otorrhea, right ear: H92.11

## 2019-12-26 HISTORY — DX: Personal history of other diseases of the nervous system and sense organs: Z86.69

## 2019-12-26 HISTORY — DX: Persons encountering health services in other specified circumstances: Z76.89

## 2019-12-26 MED ORDER — AMLODIPINE BESYLATE 10 MG PO TABS: 10.0000 mg | ORAL_TABLET | Freq: Every day | ORAL | 0 refills | Status: DC

## 2019-12-26 MED ORDER — DEBROX 6.5 % OT SOLN: 5.0000 [drp] | Freq: Two times a day (BID) | OTIC | 0 refills | Status: DC

## 2019-12-26 MED ORDER — CHLORTHALIDONE 25 MG PO TABS: 12.5000 mg | ORAL_TABLET | Freq: Every day | ORAL | 1 refills | Status: DC

## 2019-12-26 NOTE — Progress Notes (Signed)
Patient ID: Margaret Newman, female   DOB: [birth year], 39 y.o.   MRN: [id number]  Chief Complaint  Patient presents with  . Establish Care  . Hypertension    HPI Margaret Newman is a 39 y.o. female to establish care and evaluation of her chronic conditions. She states that she has a history of hypertension and takes Amlodipine 10 mg daily. She reports that she checks her blood pressure at home and it's usually in the low to mid 160's/ 80's. She also states that she snores while sleeping and per her boyfriend, she experiences intermittent period of apnea. He also reports that she has a history of Carpal tunnel syndrome when she was working at [employer]. Currently, she denies hand pain. Also she c/o right ear discomfort and cerumen in her right ear, but denies otalgia. Overall, she states that she's doing well and offers no further complaint.   Past Medical History:  Diagnosis Date  . Hypertension     Past Surgical History:  Procedure Laterality Date  . ECTOPIC PREGNANCY SURGERY  2001    No family history on file.  Social History Social History   Tobacco Use  . Smoking status: Current Every Day Smoker  . Smokeless tobacco: Never Used  Vaping Use  . Vaping Use: Never used  Substance Use Topics  . Alcohol use: Not Currently    Comment: occasionally  . Drug use: Yes    Types: Marijuana    Comment: used in high school.    No Known Allergies  Current Outpatient Medications  Medication Sig Dispense Refill  . Baclofen 5 MG TABS Take 5 mg by mouth 3 (three) times daily as needed. 15 tablet 0  . ibuprofen (ADVIL) 400 MG tablet Take 1 tablet (400 mg total) by mouth every 6 (six) hours as needed. 30 tablet 0  . AMLODIPINE-ATORVASTATIN PO Take by mouth. (Patient not taking: Reported on 12/26/2019)    . lidocaine (LIDODERM) 5 % Place 1 patch onto the skin daily. Remove & Discard patch within 12 hours or as directed by MD 30 patch 0   No current  facility-administered medications for this visit.    Review of Systems Review of Systems  Constitutional: Negative.   HENT: Negative.   Eyes: Negative.   Respiratory: Negative.   Cardiovascular: Negative.   Gastrointestinal: Negative.   Endocrine: Negative.   Genitourinary: Negative.   Musculoskeletal: Negative.   Skin: Negative.   Neurological: Negative.   Hematological: Negative.   Psychiatric/Behavioral: Negative.     Blood pressure (!) 167/107, pulse 74, height 5' 2.5" (1.588 m), weight 190 lb (86.2 kg), SpO2 99 %.  Physical Exam Physical Exam HENT:     Head: Normocephalic and atraumatic.     Right Ear: There is impacted cerumen.     Nose: Nose normal.     Mouth/Throat:     Mouth: Mucous membranes are moist.  Eyes:     Extraocular Movements: Extraocular movements intact.     Pupils: Pupils are equal, round, and reactive to light.  Cardiovascular:     Rate and Rhythm: Normal rate and regular rhythm.     Pulses: Normal pulses.     Heart sounds: Normal heart sounds.  Pulmonary:     Effort: Pulmonary effort is normal.     Breath sounds: Normal breath sounds.  Abdominal:     General: Bowel sounds are normal.     Palpations: Abdomen is soft.  Genitourinary:    Comments: Deferred per patient Musculoskeletal:  General: Normal range of motion.     Cervical back: Normal range of motion.  Skin:    General: Skin is warm and dry.  Neurological:     General: No focal deficit present.     Mental Status: She is alert and oriented to person, place, and time. Mental status is at baseline.  Psychiatric:        Mood and Affect: Mood normal.        Behavior: Behavior normal.        Thought Content: Thought content normal.        Judgment: Judgment normal.     Data Reviewed Lab and past medical history was reviewed.  Assessment and Plan  1. Encounter to establish care - Routine labs will be checked - HgB A1c; Future - Comp Met (CMET); Future - CBC w/Diff;  Future - TSH; Future - Urinalysis; Future - Ambulatory referral to Hematology / Oncology  2. History of carpal tunnel syndrome - Her CTS is under control, she was advised to notify clinic with symptoms.  3. Essential hypertension - Her blood pressure is not controlled, she will start 12.5 mg Chlorthalidone, advised on medication side effects and advised to notify clinic. - chlorthalidone (HYGROTON) 25 MG tablet; Take 0.5 tablets (12.5 mg total) by mouth daily.  Dispense: 30 tablet; Refill: 1 - amLODipine (NORVASC) 10 MG tablet; Take 1 tablet (10 mg total) by mouth daily.  Dispense: 30 tablet; Refill: 0 -Low salt DASH diet -Take medications regularly on time -Exercise regularly as tolerated -Check blood pressure at least once a week at home or a nearby pharmacy and record -Goal is less than 140/90 and normal blood pressure is 120/80    4. Snoring - She was advised to complete [facility] financial application for  - [facility] Sleep Study; Future  5. Right ear impacted cerumen - carbamide peroxide (DEBROX) 6.5 % OTIC solution; Place 5 drops into the right ear 2 (two) times daily.  Dispense: 15 mL; Refill: 0   Follow up: 01/16/2020 if symptoms worsen or fail to improve.  Margaret Newman 12/26/2019, 9:57 AM

## 2019-12-26 NOTE — Patient Instructions (Signed)
DASH Eating Plan DASH stands for "Dietary Approaches to Stop Hypertension." The DASH eating plan is a healthy eating plan that has been shown to reduce high blood pressure (hypertension). It may also reduce your risk for type 2 diabetes, heart disease, and stroke. The DASH eating plan may also help with weight loss. What are tips for following this plan?  General guidelines  Avoid eating more than 2,300 mg (milligrams) of salt (sodium) a day. If you have hypertension, you may need to reduce your sodium intake to 1,500 mg a day.  Limit alcohol intake to no more than 1 drink a day for nonpregnant women and 2 drinks a day for men. One drink equals 12 oz of beer, 5 oz of wine, or 1 oz of hard liquor.  Work with your health care provider to maintain a healthy body weight or to lose weight. Ask what an ideal weight is for you.  Get at least 30 minutes of exercise that causes your heart to beat faster (aerobic exercise) most days of the week. Activities may include walking, swimming, or biking.  Work with your health care provider or diet and nutrition specialist (dietitian) to adjust your eating plan to your individual calorie needs. Reading food labels   Check food labels for the amount of sodium per serving. Choose foods with less than 5 percent of the Daily Value of sodium. Generally, foods with less than 300 mg of sodium per serving fit into this eating plan.  To find whole grains, look for the word "whole" as the first word in the ingredient list. Shopping  Buy products labeled as "low-sodium" or "no salt added."  Buy fresh foods. Avoid canned foods and premade or frozen meals. Cooking  Avoid adding salt when cooking. Use salt-free seasonings or herbs instead of table salt or sea salt. Check with your health care provider or pharmacist before using salt substitutes.  Do not fry foods. Cook foods using healthy methods such as baking, boiling, grilling, and broiling instead.  Cook with  heart-healthy oils, such as olive, canola, soybean, or sunflower oil. Meal planning  Eat a balanced diet that includes: ? 5 or more servings of fruits and vegetables each day. At each meal, try to fill half of your plate with fruits and vegetables. ? Up to 6-8 servings of whole grains each day. ? Less than 6 oz of lean meat, poultry, or fish each day. A 3-oz serving of meat is about the same size as a deck of cards. One egg equals 1 oz. ? 2 servings of low-fat dairy each day. ? A serving of nuts, seeds, or beans 5 times each week. ? Heart-healthy fats. Healthy fats called Omega-3 fatty acids are found in foods such as flaxseeds and coldwater fish, like sardines, salmon, and mackerel.  Limit how much you eat of the following: ? Canned or prepackaged foods. ? Food that is high in trans fat, such as fried foods. ? Food that is high in saturated fat, such as fatty meat. ? Sweets, desserts, sugary drinks, and other foods with added sugar. ? Full-fat dairy products.  Do not salt foods before eating.  Try to eat at least 2 vegetarian meals each week.  Eat more home-cooked food and less restaurant, buffet, and fast food.  When eating at a restaurant, ask that your food be prepared with less salt or no salt, if possible. What foods are recommended? The items listed may not be a complete list. Talk with your dietitian about   what dietary choices are best for you. Grains Whole-grain or whole-wheat bread. Whole-grain or whole-wheat pasta. Brown rice. Oatmeal. Quinoa. Bulgur. Whole-grain and low-sodium cereals. Pita bread. Low-fat, low-sodium crackers. Whole-wheat flour tortillas. Vegetables Fresh or frozen vegetables (raw, steamed, roasted, or grilled). Low-sodium or reduced-sodium tomato and vegetable juice. Low-sodium or reduced-sodium tomato sauce and tomato paste. Low-sodium or reduced-sodium canned vegetables. Fruits All fresh, dried, or frozen fruit. Canned fruit in natural juice (without  added sugar). Meat and other protein foods Skinless chicken or turkey. Ground chicken or turkey. Pork with fat trimmed off. Fish and seafood. Egg whites. Dried beans, peas, or lentils. Unsalted nuts, nut butters, and seeds. Unsalted canned beans. Lean cuts of beef with fat trimmed off. Low-sodium, lean deli meat. Dairy Low-fat (1%) or fat-free (skim) milk. Fat-free, low-fat, or reduced-fat cheeses. Nonfat, low-sodium ricotta or cottage cheese. Low-fat or nonfat yogurt. Low-fat, low-sodium cheese. Fats and oils Soft margarine without trans fats. Vegetable oil. Low-fat, reduced-fat, or light mayonnaise and salad dressings (reduced-sodium). Canola, safflower, olive, soybean, and sunflower oils. Avocado. Seasoning and other foods Herbs. Spices. Seasoning mixes without salt. Unsalted popcorn and pretzels. Fat-free sweets. What foods are not recommended? The items listed may not be a complete list. Talk with your dietitian about what dietary choices are best for you. Grains Baked goods made with fat, such as croissants, muffins, or some breads. Dry pasta or rice meal packs. Vegetables Creamed or fried vegetables. Vegetables in a cheese sauce. Regular canned vegetables (not low-sodium or reduced-sodium). Regular canned tomato sauce and paste (not low-sodium or reduced-sodium). Regular tomato and vegetable juice (not low-sodium or reduced-sodium). Pickles. Olives. Fruits Canned fruit in a light or heavy syrup. Fried fruit. Fruit in cream or butter sauce. Meat and other protein foods Fatty cuts of meat. Ribs. Fried meat. Bacon. Sausage. Bologna and other processed lunch meats. Salami. Fatback. Hotdogs. Bratwurst. Salted nuts and seeds. Canned beans with added salt. Canned or smoked fish. Whole eggs or egg yolks. Chicken or turkey with skin. Dairy Whole or 2% milk, cream, and half-and-half. Whole or full-fat cream cheese. Whole-fat or sweetened yogurt. Full-fat cheese. Nondairy creamers. Whipped toppings.  Processed cheese and cheese spreads. Fats and oils Butter. Stick margarine. Lard. Shortening. Ghee. Bacon fat. Tropical oils, such as coconut, palm kernel, or palm oil. Seasoning and other foods Salted popcorn and pretzels. Onion salt, garlic salt, seasoned salt, table salt, and sea salt. Worcestershire sauce. Tartar sauce. Barbecue sauce. Teriyaki sauce. Soy sauce, including reduced-sodium. Steak sauce. Canned and packaged gravies. Fish sauce. Oyster sauce. Cocktail sauce. Horseradish that you find on the shelf. Ketchup. Mustard. Meat flavorings and tenderizers. Bouillon cubes. Hot sauce and Tabasco sauce. Premade or packaged marinades. Premade or packaged taco seasonings. Relishes. Regular salad dressings. Where to find more information:  National Heart, Lung, and Blood Institute: www.nhlbi.nih.gov  American Heart Association: www.heart.org Summary  The DASH eating plan is a healthy eating plan that has been shown to reduce high blood pressure (hypertension). It may also reduce your risk for type 2 diabetes, heart disease, and stroke.  With the DASH eating plan, you should limit salt (sodium) intake to 2,300 mg a day. If you have hypertension, you may need to reduce your sodium intake to 1,500 mg a day.  When on the DASH eating plan, aim to eat more fresh fruits and vegetables, whole grains, lean proteins, low-fat dairy, and heart-healthy fats.  Work with your health care provider or diet and nutrition specialist (dietitian) to adjust your eating plan to your   individual calorie needs. This information is not intended to replace advice given to you by your health care provider. Make sure you discuss any questions you have with your health care provider. Document Revised: 05/12/2017 Document Reviewed: 05/23/2016 Elsevier Patient Education  2020 [employer]  

## 2019-12-30 ENCOUNTER — Ambulatory Visit: Payer: Self-pay | Admitting: Pharmacy Technician

## 2019-12-30 ENCOUNTER — Other Ambulatory Visit: Payer: Self-pay

## 2019-12-30 DIAGNOSIS — Z79899 Other long term (current) drug therapy: Secondary | ICD-10-CM

## 2019-12-31 NOTE — Progress Notes (Signed)
Completed [facility] application and contract.  Patient agreed to all terms of the [facility] contract.    Patient approved to receive medication assistance at [facility] until time for re-certification in 4401, and as long as eligibility criteria continues to be met.    Provided patient with community resource material based on her particular needs.    [facility]

## 2020-01-08 ENCOUNTER — Other Ambulatory Visit: Payer: Self-pay

## 2020-01-08 DIAGNOSIS — Z7689 Persons encountering health services in other specified circumstances: Secondary | ICD-10-CM

## 2020-01-09 LAB — COMPREHENSIVE METABOLIC PANEL
ALT: 19 IU/L (ref 0–32)
AST: 15 IU/L (ref 0–40)
Albumin/Globulin Ratio: 1.5 (ref 1.2–2.2)
Albumin: 4.4 g/dL (ref 3.8–4.8)
Alkaline Phosphatase: 109 IU/L (ref 48–121)
BUN/Creatinine Ratio: 17 (ref 9–23)
BUN: 12 mg/dL (ref 6–24)
Bilirubin Total: 0.2 mg/dL (ref 0.0–1.2)
CO2: 24 mmol/L (ref 20–29)
Calcium: 9 mg/dL (ref 8.7–10.2)
Chloride: 97 mmol/L (ref 96–106)
Creatinine, Ser: 0.72 mg/dL (ref 0.57–1.00)
GFR calc Af Amer: 120 mL/min/{1.73_m2} (ref >59)
GFR calc non Af Amer: 104 mL/min/{1.73_m2} (ref >59)
Globulin, Total: 3 g/dL (ref 1.5–4.5)
Glucose: 91 mg/dL (ref 65–99)
Potassium: 3.6 mmol/L (ref 3.5–5.2)
Sodium: 134 mmol/L (ref 134–144)
Total Protein: 7.4 g/dL (ref 6.0–8.5)

## 2020-01-09 LAB — CBC WITH DIFFERENTIAL/PLATELET
Basophils Absolute: 0.1 x10E3/uL (ref 0.0–0.2)
Basos: 1 %
EOS (ABSOLUTE): 0.2 x10E3/uL (ref 0.0–0.4)
Eos: 3 %
Hematocrit: 37.8 % (ref 34.0–46.6)
Hemoglobin: 11.6 g/dL (ref 11.1–15.9)
Immature Grans (Abs): 0 x10E3/uL (ref 0.0–0.1)
Immature Granulocytes: 0 %
Lymphocytes Absolute: 2.2 x10E3/uL (ref 0.7–3.1)
Lymphs: 31 %
MCH: 24.5 pg — ABNORMAL LOW (ref 26.6–33.0)
MCHC: 30.7 g/dL — ABNORMAL LOW (ref 31.5–35.7)
MCV: 80 fL (ref 79–97)
Monocytes Absolute: 0.9 x10E3/uL (ref 0.1–0.9)
Monocytes: 13 %
Neutrophils Absolute: 3.6 x10E3/uL (ref 1.4–7.0)
Neutrophils: 52 %
Platelets: 315 x10E3/uL (ref 150–450)
RBC: 4.74 x10E6/uL (ref 3.77–5.28)
RDW: 18.5 % — ABNORMAL HIGH (ref 11.7–15.4)
WBC: 7 x10E3/uL (ref 3.4–10.8)

## 2020-01-09 LAB — URINALYSIS
Bilirubin, UA: NEGATIVE
Glucose, UA: NEGATIVE
Leukocytes,UA: NEGATIVE
Nitrite, UA: NEGATIVE
Specific Gravity, UA: 1.02 (ref 1.005–1.030)
Urobilinogen, Ur: 0.2 mg/dL (ref 0.2–1.0)
pH, UA: 6 (ref 5.0–7.5)

## 2020-01-09 LAB — HEMOGLOBIN A1C
Est. average glucose Bld gHb Est-mCnc: 128 mg/dL
Hgb A1c MFr Bld: 6.1 % — ABNORMAL HIGH (ref 4.8–5.6)

## 2020-01-09 LAB — TSH: TSH: 0.601 u[IU]/mL (ref 0.450–4.500)

## 2020-01-21 ENCOUNTER — Ambulatory Visit: Payer: Self-pay | Admitting: Gerontology

## 2020-02-05 ENCOUNTER — Other Ambulatory Visit: Payer: Self-pay

## 2020-02-05 ENCOUNTER — Ambulatory Visit: Payer: Self-pay | Admitting: Gerontology

## 2020-02-05 DIAGNOSIS — I10 Essential (primary) hypertension: Secondary | ICD-10-CM

## 2020-02-05 MED ORDER — AMLODIPINE BESYLATE 10 MG PO TABS: 10.0000 mg | ORAL_TABLET | Freq: Every day | ORAL | 0 refills | Status: DC

## 2020-02-06 ENCOUNTER — Other Ambulatory Visit: Payer: Self-pay

## 2020-02-06 ENCOUNTER — Ambulatory Visit: Payer: Self-pay | Admitting: Gerontology

## 2020-02-06 DIAGNOSIS — I10 Essential (primary) hypertension: Secondary | ICD-10-CM

## 2020-02-06 DIAGNOSIS — R7303 Prediabetes: Secondary | ICD-10-CM | POA: Insufficient documentation

## 2020-02-06 DIAGNOSIS — R899 Unspecified abnormal finding in specimens from other organs, systems and tissues: Secondary | ICD-10-CM | POA: Insufficient documentation

## 2020-02-06 DIAGNOSIS — R0683 Snoring: Secondary | ICD-10-CM

## 2020-02-06 HISTORY — DX: Prediabetes: R73.03

## 2020-02-06 MED ORDER — CHLORTHALIDONE 25 MG PO TABS: 25.0000 mg | ORAL_TABLET | Freq: Every day | ORAL | 3 refills | Status: DC

## 2020-02-06 MED ORDER — AMLODIPINE BESYLATE 10 MG PO TABS: 10.0000 mg | ORAL_TABLET | Freq: Every day | ORAL | 3 refills | Status: DC

## 2020-02-06 NOTE — Patient Instructions (Signed)
Hypertension, Adult Hypertension is another name for high blood pressure. High blood pressure forces your heart to work harder to pump blood. This can cause problems over time. There are two numbers in a blood pressure reading. There is a top number (systolic) over a bottom number (diastolic). It is best to have a blood pressure that is below 120/80. Healthy choices can help lower your blood pressure, or you may need medicine to help lower it. What are the causes? The cause of this condition is not known. Some conditions may be related to high blood pressure. What increases the risk?  Smoking.  Having type 2 diabetes mellitus, high cholesterol, or both.  Not getting enough exercise or physical activity.  Being overweight.  Having too much fat, sugar, calories, or salt (sodium) in your diet.  Drinking too much alcohol.  Having long-term (chronic) kidney disease.  Having a family history of high blood pressure.  Age. Risk increases with age.  Race. You may be at higher risk if you are African American.  Gender. Men are at higher risk than women before age 85. After age 20, women are at higher risk than men.  Having obstructive sleep apnea.  Stress. What are the signs or symptoms?  High blood pressure may not cause symptoms. Very high blood pressure (hypertensive crisis) may cause: ? Headache. ? Feelings of worry or nervousness (anxiety). ? Shortness of breath. ? Nosebleed. ? A feeling of being sick to your stomach (nausea). ? Throwing up (vomiting). ? Changes in how you see. ? Very bad chest pain. ? Seizures. How is this treated?  This condition is treated by making healthy lifestyle changes, such as: ? Eating healthy foods. ? Exercising more. ? Drinking less alcohol.  Your health care provider may prescribe medicine if lifestyle changes are not enough to get your blood pressure under control, and if: ? Your top number is above 130. ? Your bottom number is above  80.  Your personal target blood pressure may vary. Follow these instructions at home: Eating and drinking   If told, follow the DASH eating plan. To follow this plan: ? Fill one half of your plate at each meal with fruits and vegetables. ? Fill one fourth of your plate at each meal with whole grains. Whole grains include whole-wheat pasta, brown rice, and whole-grain bread. ? Eat or drink low-fat dairy products, such as skim milk or low-fat yogurt. ? Fill one fourth of your plate at each meal with low-fat (lean) proteins. Low-fat proteins include fish, chicken without skin, eggs, beans, and tofu. ? Avoid fatty meat, cured and processed meat, or chicken with skin. ? Avoid pre-made or processed food.  Eat less than 1,500 mg of salt each day.  Do not drink alcohol if: ? Your doctor tells you not to drink. ? You are pregnant, may be pregnant, or are planning to become pregnant.  If you drink alcohol: ? Limit how much you use to:  0-1 drink a day for women.  0-2 drinks a day for men. ? Be aware of how much alcohol is in your drink. In the [place] one drink equals one 12 oz bottle of beer (355 mL), one 5 oz glass of wine (148 mL), or one 1 oz glass of hard liquor (44 mL). Lifestyle   Work with your doctor to stay at a healthy weight or to lose weight. Ask your doctor what the best weight is for you.  Get at least 30 minutes of exercise most  days of the week. This may include walking, swimming, or biking.  Get at least 30 minutes of exercise that strengthens your muscles (resistance exercise) at least 3 days a week. This may include lifting weights or doing Pilates.  Do not use any products that contain nicotine or tobacco, such as cigarettes, e-cigarettes, and chewing tobacco. If you need help quitting, ask your doctor.  Check your blood pressure at home as told by your doctor.  Keep all follow-up visits as told by your doctor. This is important. Medicines  Take over-the-counter  and prescription medicines only as told by your doctor. Follow directions carefully.  Do not skip doses of blood pressure medicine. The medicine does not work as well if you skip doses. Skipping doses also puts you at risk for problems.  Ask your doctor about side effects or reactions to medicines that you should watch for. Contact a doctor if you:  Think you are having a reaction to the medicine you are taking.  Have headaches that keep coming back (recurring).  Feel dizzy.  Have swelling in your ankles.  Have trouble with your vision. Get help right away if you:  Get a very bad headache.  Start to feel mixed up (confused).  Feel weak or numb.  Feel faint.  Have very bad pain in your: ? Chest. ? Belly (abdomen).  Throw up more than once.  Have trouble breathing. Summary  Hypertension is another name for high blood pressure.  High blood pressure forces your heart to work harder to pump blood.  For most people, a normal blood pressure is less than 120/80.  Making healthy choices can help lower blood pressure. If your blood pressure does not get lower with healthy choices, you may need to take medicine. This information is not intended to replace advice given to you by your health care provider. Make sure you discuss any questions you have with your health care provider. Document Revised: 02/07/2018 Document Reviewed: 02/07/2018 Elsevier Patient Education  2020 [place].  Prediabetes Eating Plan Prediabetes is a condition that causes blood sugar (glucose) levels to be higher than normal. This increases the risk for developing diabetes. In order to prevent diabetes from developing, your health care provider may recommend a diet and other lifestyle changes to help you:  Control your blood glucose levels.  Improve your cholesterol levels.  Manage your blood pressure. Your health care provider may recommend working with a diet and nutrition specialist (dietitian)  to make a meal plan that is best for you. What are tips for following this plan? Lifestyle  Set weight loss goals with the help of your health care team. It is recommended that most people with prediabetes lose 7% of their current body weight.  Exercise for at least 30 minutes at least 5 days a week.  Attend a support group or seek ongoing support from a mental health counselor.  Take over-the-counter and prescription medicines only as told by your health care provider. Reading food labels  Read food labels to check the amount of fat, salt (sodium), and sugar in prepackaged foods. Avoid foods that have: ? Saturated fats. ? Trans fats. ? Added sugars.  Avoid foods that have more than 300 milligrams (mg) of sodium per serving. Limit your daily sodium intake to less than 2,300 mg each day. Shopping  Avoid buying pre-made and processed foods. Cooking  Cook with olive oil. Do not use butter, lard, or ghee.  Bake, broil, grill, or boil foods. Avoid frying.  Meal planning   Work with your dietitian to develop an eating plan that is right for you. This may include: ? Tracking how many calories you take in. Use a food diary, notebook, or mobile application to track what you eat at each meal. ? Using the glycemic index (GI) to plan your meals. The index tells you how quickly a food will raise your blood glucose. Choose low-GI foods. These foods take a longer time to raise blood glucose.  Consider following a Mediterranean diet. This diet includes: ? Several servings each day of fresh fruits and vegetables. ? Eating fish at least twice a week. ? Several servings each day of whole grains, beans, nuts, and seeds. ? Using olive oil instead of other fats. ? Moderate alcohol consumption. ? Eating small amounts of red meat and whole-fat dairy.  If you have high blood pressure, you may need to limit your sodium intake or follow a diet such as the DASH eating plan. DASH is an eating plan that  aims to lower high blood pressure. What foods are recommended? The items listed below may not be a complete list. Talk with your dietitian about what dietary choices are best for you. Grains Whole grains, such as whole-wheat or whole-grain breads, crackers, cereals, and pasta. Unsweetened oatmeal. Bulgur. Barley. Quinoa. Brown rice. Corn or whole-wheat flour tortillas or taco shells. Vegetables Lettuce. Spinach. Peas. Beets. Cauliflower. Cabbage. Broccoli. Carrots. Tomatoes. Squash. Eggplant. Herbs. Peppers. Onions. Cucumbers. Brussels sprouts. Fruits Berries. Bananas. Apples. Oranges. Grapes. Papaya. Mango. Pomegranate. Kiwi. Grapefruit. Cherries. Meats and other protein foods Seafood. Poultry without skin. Lean cuts of pork and beef. Tofu. Eggs. Nuts. Beans. Dairy Low-fat or fat-free dairy products, such as yogurt, cottage cheese, and cheese. Beverages Water. Tea. Coffee. Sugar-free or diet soda. Seltzer water. Lowfat or no-fat milk. Milk alternatives, such as soy or almond milk. Fats and oils Olive oil. Canola oil. Sunflower oil. Grapeseed oil. Avocado. Walnuts. Sweets and desserts Sugar-free or low-fat pudding. Sugar-free or low-fat ice cream and other frozen treats. Seasoning and other foods Herbs. Sodium-free spices. Mustard. Relish. Low-fat, low-sugar ketchup. Low-fat, low-sugar barbecue sauce. Low-fat or fat-free mayonnaise. What foods are not recommended? The items listed below may not be a complete list. Talk with your dietitian about what dietary choices are best for you. Grains Refined white flour and flour products, such as bread, pasta, snack foods, and cereals. Vegetables Canned vegetables. Frozen vegetables with butter or cream sauce. Fruits Fruits canned with syrup. Meats and other protein foods Fatty cuts of meat. Poultry with skin. Breaded or fried meat. Processed meats. Dairy Full-fat yogurt, cheese, or milk. Beverages Sweetened drinks, such as sweet iced tea and  soda. Fats and oils Butter. Lard. Ghee. Sweets and desserts Baked goods, such as cake, cupcakes, pastries, cookies, and cheesecake. Seasoning and other foods Spice mixes with added salt. Ketchup. Barbecue sauce. Mayonnaise. Summary  To prevent diabetes from developing, you may need to make diet and other lifestyle changes to help control blood sugar, improve cholesterol levels, and manage your blood pressure.  Set weight loss goals with the help of your health care team. It is recommended that most people with prediabetes lose 7 percent of their current body weight.  Consider following a Mediterranean diet that includes plenty of fresh fruits and vegetables, whole grains, beans, nuts, seeds, fish, lean meat, low-fat dairy, and healthy oils. This information is not intended to replace advice given to you by your health care provider. Make sure you discuss any questions you  have with your health care provider. Document Revised: 09/21/2018 Document Reviewed: 08/03/2016 Elsevier Patient Education  2020 [employer].

## 2020-02-06 NOTE — Progress Notes (Signed)
Established Patient Office Visit  Subjective:  Patient ID: Margaret Newman, female    DOB: [birth year]  Age: 39 y.o. MRN: [id number]  CC: No chief complaint on file.  Patient consents to telephone visit and 2 patient identifiers was used to identify patient.  HPI Margaret Newman presents for follow up of hypertension and lab review. She states that she's complaint with her medications and continues to make healthy lifestyle changes. She states that she checks her blood pressure twice weekly and it's usually less than 120/85. She continues to some 1 pack of cigarette and admits the desire to quit. Her HgbA1c done on 7/28/2021was 6.1% and she states she drinks large quantities of Soda. Her urinalysis showed trace hematuria and she denies gross hematuria. She also reports that she snores while sleeping and family members reports that she experiences occasional periods of apnea. She denies fatigue, fever and chills. Overall, she states that she's doing well and offers no further complaint.  Past Medical History:  Diagnosis Date  . Hypertension     Past Surgical History:  Procedure Laterality Date  . ECTOPIC PREGNANCY SURGERY  2001    No family history on file.  Social History   Socioeconomic History  . Marital status: Single    Spouse name: Not on file  . Number of children: Not on file  . Years of education: Not on file  . Highest education level: Not on file  Occupational History  . Not on file  Tobacco Use  . Smoking status: Current Every Day Smoker  . Smokeless tobacco: Never Used  Vaping Use  . Vaping Use: Never used  Substance and Sexual Activity  . Alcohol use: Not Currently    Comment: occasionally  . Drug use: Yes    Types: Marijuana    Comment: used in high school.  . Sexual activity: Yes  Other Topics Concern  . Not on file  Social History Narrative  . Not on file   Social Determinants of Health   Financial Resource Strain:   . Difficulty of Paying  Living Expenses: Not on file  Food Insecurity:   . Worried About Charity fundraiser in the Last Year: Not on file  . Ran Out of Food in the Last Year: Not on file  Transportation Needs:   . Lack of Transportation (Medical): Not on file  . Lack of Transportation (Non-Medical): Not on file  Physical Activity:   . Days of Exercise per Week: Not on file  . Minutes of Exercise per Session: Not on file  Stress:   . Feeling of Stress : Not on file  Social Connections:   . Frequency of Communication with Friends and Family: Not on file  . Frequency of Social Gatherings with Friends and Family: Not on file  . Attends Religious Services: Not on file  . Active Member of Clubs or Organizations: Not on file  . Attends Archivist Meetings: Not on file  . Marital Status: Not on file  Intimate Partner Violence:   . Fear of Current or Ex-Partner: Not on file  . Emotionally Abused: Not on file  . Physically Abused: Not on file  . Sexually Abused: Not on file    Outpatient Medications Prior to Visit  Medication Sig Dispense Refill  . Baclofen 5 MG TABS Take 5 mg by mouth 3 (three) times daily as needed. 15 tablet 0  . ibuprofen (ADVIL) 400 MG tablet Take 1 tablet (400 mg total) by mouth every  6 (six) hours as needed. 30 tablet 0  . lidocaine (LIDODERM) 5 % Place 1 patch onto the skin daily. Remove & Discard patch within 12 hours or as directed by MD 30 patch 0  . amLODipine (NORVASC) 10 MG tablet Take 1 tablet (10 mg total) by mouth daily. 30 tablet 0  . carbamide peroxide (DEBROX) 6.5 % OTIC solution Place 5 drops into the right ear 2 (two) times daily. 15 mL 0  . chlorthalidone (HYGROTON) 25 MG tablet Take 0.5 tablets (12.5 mg total) by mouth daily. 30 tablet 1   No facility-administered medications prior to visit.    No Known Allergies  ROS Review of Systems  Constitutional: Negative.   Eyes: Negative.   Respiratory: Negative.   Cardiovascular: Negative.   Genitourinary:  Negative.   Neurological: Negative.   Psychiatric/Behavioral: Negative.       Objective:    Physical Exam No physical exam was done There were no vitals taken for this visit. Wt Readings from Last 3 Encounters:  01/09/20 190 lb 3.2 oz (86.3 kg)  12/26/19 190 lb (86.2 kg)  09/19/19 185 lb (83.9 kg)     Health Maintenance Due  Topic Date Due  . COVID-19 Vaccine (1) Never done  . TETANUS/TDAP  Never done  . PAP SMEAR-Modifier  Never done  . INFLUENZA VACCINE  01/12/2020    There are no preventive care reminders to display for this patient.  Lab Results  Component Value Date   TSH 0.601 01/08/2020   Lab Results  Component Value Date   WBC 7.0 01/08/2020   HGB 11.6 01/08/2020   HCT 37.8 01/08/2020   MCV 80 01/08/2020   PLT 315 01/08/2020   Lab Results  Component Value Date   NA 134 01/08/2020   K 3.6 01/08/2020   CO2 24 01/08/2020   GLUCOSE 91 01/08/2020   BUN 12 01/08/2020   CREATININE 0.72 01/08/2020   BILITOT 0.2 01/08/2020   ALKPHOS 109 01/08/2020   AST 15 01/08/2020   ALT 19 01/08/2020   PROT 7.4 01/08/2020   ALBUMIN 4.4 01/08/2020   CALCIUM 9.0 01/08/2020   ANIONGAP 10 04/25/2019   No results found for: CHOL No results found for: HDL No results found for: LDLCALC No results found for: TRIG No results found for: CHOLHDL Lab Results  Component Value Date   HGBA1C 6.1 (H) 01/08/2020      Assessment & Plan:   1. Essential hypertension - She will continue on current treatment regimen, advised to check blood pressure daily, record and bring log to follow up appointment. - She was advised to continue on DASH diet, exercise as tolerated and smoking cessation. - amLODipine (NORVASC) 10 MG tablet; Take 1 tablet (10 mg total) by mouth daily.  Dispense: 30 tablet; Refill: 3 - chlorthalidone (HYGROTON) 25 MG tablet; Take 1 tablet (25 mg total) by mouth daily.  Dispense: 30 tablet; Refill: 3  2. Prediabetes - Her HgbA1c was 6.1%, she declines metformin  therapy, states will work on her diet. She was advised to continue on low carb/non concentrated sweet diet, and exercise as tolerated. - HgB A1c; Future  3. Snoring - She was advised to complete [facility] financial application for - Ambulatory referral to Neurology  4. Abnormal laboratory test - Labs will be rechecked to rule out/in Iron deficiency anemia - CBC w/Diff; Future - Iron Binding Cap (TIBC)([facility]); Future - Urinalysis; Future - Lipid panel; Future     Follow-up: Return in about 2 months (around  04/15/2020), or if symptoms worsen or fail to improve.    Penelopi Mikrut Jerold Coombe, NP

## 2020-04-08 ENCOUNTER — Other Ambulatory Visit: Payer: Self-pay

## 2020-04-15 ENCOUNTER — Ambulatory Visit: Payer: Self-pay | Admitting: Gerontology

## 2020-04-16 ENCOUNTER — Telehealth: Payer: Self-pay | Admitting: Gerontology

## 2020-04-28 ENCOUNTER — Other Ambulatory Visit: Payer: Self-pay | Admitting: Gerontology

## 2020-04-28 DIAGNOSIS — I10 Essential (primary) hypertension: Secondary | ICD-10-CM

## 2020-04-28 MED ORDER — CHLORTHALIDONE 25 MG PO TABS: 25.0000 mg | ORAL_TABLET | Freq: Every day | ORAL | 1 refills | Status: DC

## 2020-04-28 MED ORDER — AMLODIPINE BESYLATE 10 MG PO TABS: 10.0000 mg | ORAL_TABLET | Freq: Every day | ORAL | 1 refills | Status: DC

## 2020-04-29 ENCOUNTER — Other Ambulatory Visit: Payer: Self-pay | Admitting: Gerontology

## 2020-04-29 ENCOUNTER — Other Ambulatory Visit: Payer: Self-pay

## 2020-04-29 DIAGNOSIS — R899 Unspecified abnormal finding in specimens from other organs, systems and tissues: Secondary | ICD-10-CM

## 2020-04-29 DIAGNOSIS — R7303 Prediabetes: Secondary | ICD-10-CM

## 2020-04-30 ENCOUNTER — Other Ambulatory Visit: Payer: Self-pay | Admitting: Gerontology

## 2020-04-30 DIAGNOSIS — D509 Iron deficiency anemia, unspecified: Secondary | ICD-10-CM

## 2020-04-30 LAB — URINALYSIS
Bilirubin, UA: NEGATIVE
Glucose, UA: NEGATIVE
Ketones, UA: NEGATIVE
Leukocytes,UA: NEGATIVE
Nitrite, UA: NEGATIVE
Protein,UA: NEGATIVE
RBC, UA: NEGATIVE
Specific Gravity, UA: 1.013 (ref 1.005–1.030)
Urobilinogen, Ur: 0.2 mg/dL (ref 0.2–1.0)
pH, UA: 6 (ref 5.0–7.5)

## 2020-04-30 LAB — CBC WITH DIFFERENTIAL/PLATELET
Basophils Absolute: 0.1 10*3/uL (ref 0.0–0.2)
Basos: 1 %
EOS (ABSOLUTE): 0.3 10*3/uL (ref 0.0–0.4)
Eos: 4 %
Hematocrit: 37.4 % (ref 34.0–46.6)
Hemoglobin: 11.7 g/dL (ref 11.1–15.9)
Immature Grans (Abs): 0 10*3/uL (ref 0.0–0.1)
Immature Granulocytes: 0 %
Lymphocytes Absolute: 2.5 10*3/uL (ref 0.7–3.1)
Lymphs: 36 %
MCH: 25.3 pg — ABNORMAL LOW (ref 26.6–33.0)
MCHC: 31.3 g/dL — ABNORMAL LOW (ref 31.5–35.7)
MCV: 81 fL (ref 79–97)
Monocytes Absolute: 0.7 10*3/uL (ref 0.1–0.9)
Monocytes: 10 %
Neutrophils Absolute: 3.4 10*3/uL (ref 1.4–7.0)
Neutrophils: 49 %
Platelets: 279 10*3/uL (ref 150–450)
RBC: 4.63 x10E6/uL (ref 3.77–5.28)
RDW: 19.4 % — ABNORMAL HIGH (ref 11.7–15.4)
WBC: 7 10*3/uL (ref 3.4–10.8)

## 2020-04-30 LAB — LIPID PANEL
Chol/HDL Ratio: 5.4 ratio — ABNORMAL HIGH (ref 0.0–4.4)
Cholesterol, Total: 223 mg/dL — ABNORMAL HIGH (ref 100–199)
HDL: 41 mg/dL (ref >39)
LDL Chol Calc (NIH): 160 mg/dL — ABNORMAL HIGH (ref 0–99)
Triglycerides: 123 mg/dL (ref 0–149)
VLDL Cholesterol Cal: 22 mg/dL (ref 5–40)

## 2020-04-30 LAB — HEMOGLOBIN A1C
Est. average glucose Bld gHb Est-mCnc: 126 mg/dL
Hgb A1c MFr Bld: 6 % — ABNORMAL HIGH (ref 4.8–5.6)

## 2020-04-30 LAB — IRON AND TIBC
Iron Saturation: 5 % — CL (ref 15–55)
Iron: 19 ug/dL — ABNORMAL LOW (ref 27–159)
Total Iron Binding Capacity: 404 ug/dL (ref 250–450)
UIBC: 385 ug/dL (ref 131–425)

## 2020-04-30 MED ORDER — FERROUS SULFATE 325 (65 FE) MG PO TABS: 325.0000 mg | ORAL_TABLET | Freq: Every day | ORAL | 3 refills | Status: DC

## 2020-05-05 ENCOUNTER — Ambulatory Visit: Payer: Self-pay | Admitting: Gerontology

## 2020-05-06 ENCOUNTER — Other Ambulatory Visit: Payer: Self-pay

## 2020-05-11 ENCOUNTER — Inpatient Hospital Stay: Payer: Self-pay | Attending: Oncology | Admitting: Oncology

## 2020-05-11 ENCOUNTER — Inpatient Hospital Stay: Payer: Self-pay

## 2020-05-11 DIAGNOSIS — D509 Iron deficiency anemia, unspecified: Secondary | ICD-10-CM | POA: Insufficient documentation

## 2020-05-11 DIAGNOSIS — F5089 Other specified eating disorder: Secondary | ICD-10-CM | POA: Insufficient documentation

## 2020-05-11 DIAGNOSIS — R5383 Other fatigue: Secondary | ICD-10-CM | POA: Insufficient documentation

## 2020-05-11 DIAGNOSIS — D508 Other iron deficiency anemias: Secondary | ICD-10-CM

## 2020-05-11 DIAGNOSIS — Z79899 Other long term (current) drug therapy: Secondary | ICD-10-CM | POA: Insufficient documentation

## 2020-05-12 ENCOUNTER — Encounter: Payer: Self-pay | Admitting: Oncology

## 2020-05-12 NOTE — Progress Notes (Signed)
Hematology/Oncology Consult note [facility] Telephone:([phone] [phone]  Patient Care Team: Langston Reusing, NP as PCP - General (Gerontology)   Name of the patient: Margaret Newman  [id number]  23-Sep-1980    Reason for referral-iron deficiency anemia   Referring physician- Waymon Amato NP  Date of visit: 05/12/20   History of presenting illness- Patient is a 39 year old African-American female who has been referred for iron deficiency anemia.  Most recent CBC from 04/29/2020 showed white count of 7, H&H of 11.7/37.4 with an MCV of 81 and a platelet count of 279.  Iron study showed a low iron saturation of 5% and a low serum iron of 19.  Patient states that she still gets her menstrual cycles sometimes twice a month and they last for about 5 to 7 days but they are not particularly heavy.  States that she has been iron deficient most of her life.  Denies any consistent use of NSAIDs.  Denies any nosebleeds or gum bleeds or bright red blood per rectum or dark melanotic stools.  She has never had any endoscopy or colonoscopy.  No family history of breast, gastric, or colon cancer.  Currently patient reports ongoing fatigue and craving for ice  ECOG PS- 0  Pain scale- 0   Review of systems- Review of Systems  Constitutional: Positive for malaise/fatigue. Negative for chills, fever and weight loss.  HENT: Negative for congestion, ear discharge and nosebleeds.   Eyes: Negative for blurred vision.  Respiratory: Negative for cough, hemoptysis, sputum production, shortness of breath and wheezing.   Cardiovascular: Negative for chest pain, palpitations, orthopnea and claudication.  Gastrointestinal: Negative for abdominal pain, blood in stool, constipation, diarrhea, heartburn, melena, nausea and vomiting.  Genitourinary: Negative for dysuria, flank pain, frequency, hematuria and urgency.  Musculoskeletal: Negative for back pain, joint pain  and myalgias.  Skin: Negative for rash.  Neurological: Negative for dizziness, tingling, focal weakness, seizures, weakness and headaches.  Endo/Heme/Allergies: Does not bruise/bleed easily.  Psychiatric/Behavioral: Negative for depression and suicidal ideas. The patient does not have insomnia.     No Known Allergies  Patient Active Problem List   Diagnosis Date Noted  . Iron deficiency anemia 05/11/2020  . Prediabetes 02/06/2020  . Abnormal laboratory test 02/06/2020  . Encounter to establish care 12/26/2019  . History of carpal tunnel syndrome 12/26/2019  . Ear drainage right 12/26/2019  . Snoring 12/26/2019  . Right ear impacted cerumen 12/26/2019  . Hypertension 05/11/2019     Past Medical History:  Diagnosis Date  . Hypertension      Past Surgical History:  Procedure Laterality Date  . ECTOPIC PREGNANCY SURGERY  2001    Social History   Socioeconomic History  . Marital status: Single    Spouse name: Not on file  . Number of children: Not on file  . Years of education: Not on file  . Highest education level: Not on file  Occupational History  . Not on file  Tobacco Use  . Smoking status: Current Every Day Smoker  . Smokeless tobacco: Never Used  Vaping Use  . Vaping Use: Never used  Substance and Sexual Activity  . Alcohol use: Not Currently    Comment: occasionally  . Drug use: Yes    Types: Marijuana    Comment: used in high school.  . Sexual activity: Yes  Other Topics Concern  . Not on file  Social History Narrative  . Not on file   Social Determinants of Health  Financial Resource Strain:   . Difficulty of Paying Living Expenses: Not on file  Food Insecurity:   . Worried About Charity fundraiser in the Last Year: Not on file  . Ran Out of Food in the Last Year: Not on file  Transportation Needs:   . Lack of Transportation (Medical): Not on file  . Lack of Transportation (Non-Medical): Not on file  Physical Activity:   . Days of  Exercise per Week: Not on file  . Minutes of Exercise per Session: Not on file  Stress:   . Feeling of Stress : Not on file  Social Connections:   . Frequency of Communication with Friends and Family: Not on file  . Frequency of Social Gatherings with Friends and Family: Not on file  . Attends Religious Services: Not on file  . Active Member of Clubs or Organizations: Not on file  . Attends Archivist Meetings: Not on file  . Marital Status: Not on file  Intimate Partner Violence:   . Fear of Current or Ex-Partner: Not on file  . Emotionally Abused: Not on file  . Physically Abused: Not on file  . Sexually Abused: Not on file     No family history on file.   Current Outpatient Medications:  .  amLODipine (NORVASC) 10 MG tablet, Take 1 tablet (10 mg total) by mouth daily., Disp: 30 tablet, Rfl: 1 .  chlorthalidone (HYGROTON) 25 MG tablet, Take 1 tablet (25 mg total) by mouth daily., Disp: 30 tablet, Rfl: 1 .  ferrous sulfate 325 (65 FE) MG tablet, Take 1 tablet (325 mg total) by mouth daily with breakfast., Disp: 30 tablet, Rfl: 3   Physical exam:  Vitals:   05/11/20 1439  BP: (!) 155/99  Pulse: 87  Resp: 18  Temp: 98.9 F (37.2 C)  TempSrc: Tympanic  SpO2: 100%  Weight: 196 lb 11.2 oz (89.2 kg)   Physical Exam Constitutional:      General: She is not in acute distress. Cardiovascular:     Rate and Rhythm: Normal rate and regular rhythm.     Heart sounds: Normal heart sounds.  Pulmonary:     Effort: Pulmonary effort is normal.     Breath sounds: Normal breath sounds.  Abdominal:     General: Bowel sounds are normal.     Palpations: Abdomen is soft.  Skin:    General: Skin is warm and dry.  Neurological:     Mental Status: She is alert and oriented to person, place, and time.        CMP Latest Ref Rng & Units 01/08/2020  Glucose 65 - 99 mg/dL 91  BUN 6 - 24 mg/dL 12  Creatinine 0.57 - 1.00 mg/dL 0.72  Sodium 134 - 144 mmol/L 134  Potassium 3.5 -  5.2 mmol/L 3.6  Chloride 96 - 106 mmol/L 97  CO2 20 - 29 mmol/L 24  Calcium 8.7 - 10.2 mg/dL 9.0  Total Protein 6.0 - 8.5 g/dL 7.4  Total Bilirubin 0.0 - 1.2 mg/dL 0.2  Alkaline Phos 48 - 121 IU/L 109  AST 0 - 40 IU/L 15  ALT 0 - 32 IU/L 19   CBC Latest Ref Rng & Units 04/29/2020  WBC 3.4 - 10.8 x10E3/uL 7.0  Hemoglobin 11.1 - 15.9 g/dL 11.7  Hematocrit 34.0 - 46.6 % 37.4  Platelets 150 - 450 x10E3/uL 279    Assessment and plan- Patient is a 39 y.o. female referred for iron deficiency anemia  Patient  has mild iron deficiency anemia given that her hemoglobin is 11.7.  She has taken oral iron in the past which causes significant constipation for her.  She reports ongoing fatigue as well as craving for ice and would like to try IV iron.  Patient does not have any insurance presently and will therefore be okay to get either Venofer or Feraheme which can be reimbursed by the drug company.  I recommend 2 doses of Feraheme 510 mg IV weekly.  Discussed risks and benefits of Feraheme including all but not limited to nausea, headache, possible risk of infusion reaction.  Patient understands and agrees to proceed as planned.  I will see her back in 2 months time with CBC ferritin and iron studies as well as B12  Patient reports that her menstrual cycles are not particularly heavy.  I will therefore refer her to gastroenterology for further evaluation of iron deficiency anemia which the patient agrees to   Thank you for this kind referral and the opportunity to participate in the care of this patient   Visit Diagnosis 1. Other iron deficiency anemia     Dr. Randa Evens, MD, MPH [facility] [phone] 05/12/2020  1:32 PM

## 2020-05-15 NOTE — Progress Notes (Signed)
Patient Assistance for Feraheme as Assistance from [place] was denied due to: Patient has Insurance- Medicaid out of [employer]. Will only cover if patient doesn't have any coverage at all.

## 2020-05-21 ENCOUNTER — Inpatient Hospital Stay: Payer: Self-pay | Attending: Oncology

## 2020-05-27 ENCOUNTER — Ambulatory Visit: Payer: Self-pay | Admitting: Adult Health

## 2020-05-28 ENCOUNTER — Inpatient Hospital Stay: Payer: Self-pay | Attending: Oncology

## 2020-06-03 ENCOUNTER — Ambulatory Visit: Payer: Self-pay | Admitting: Adult Health

## 2020-06-28 IMAGING — CT CT RENAL STONE PROTOCOL
3 of 4 series · 10 of 46 positions shown, 17 images · non-contrast
Comparison: None.

CLINICAL DATA: Presents with lower back pain States bent down at
work to lift something Developed pain to left lower back States this
happened yesterday

EXAM:
CT ABDOMEN AND PELVIS WITHOUT CONTRAST
TECHNIQUE: Multidetector CT imaging of the abdomen and pelvis was performed
following the standard protocol without IV contrast.

[Series 4: lung bases · axial · 0.73mm/px · z∈[-215,-110]mm · 6 of 31 slices shown, 11 images]
[im 5/31  soft-tissue]
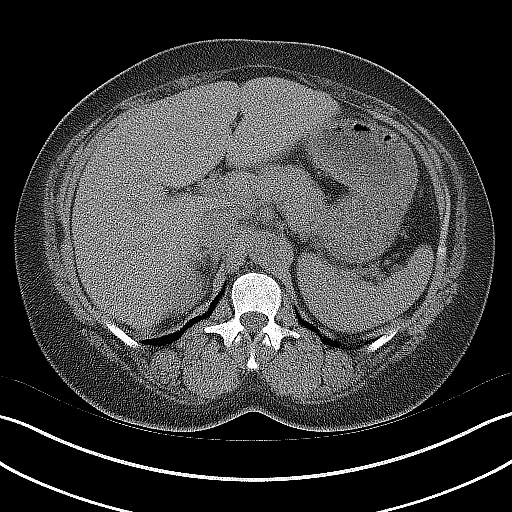
[im 5/31  bone]
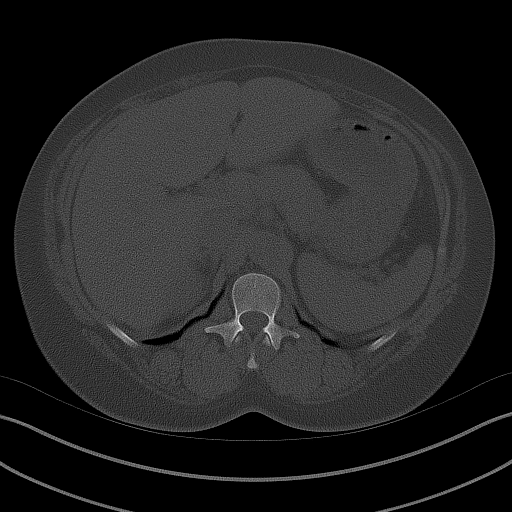
[im 9/31  soft-tissue]
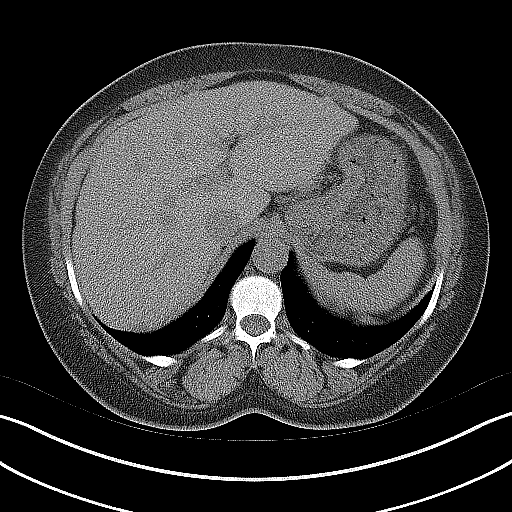
[im 13/31  soft-tissue]
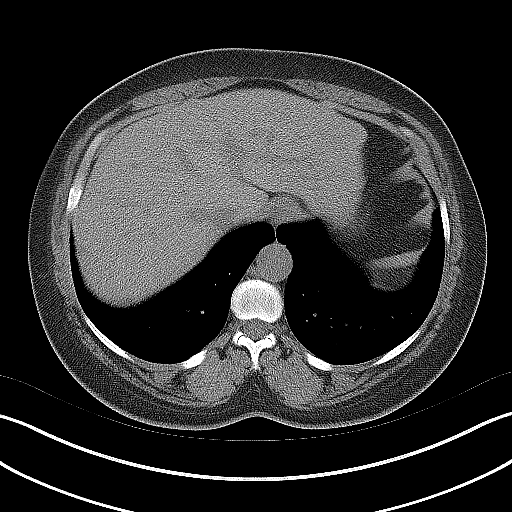
[im 13/31  lung]
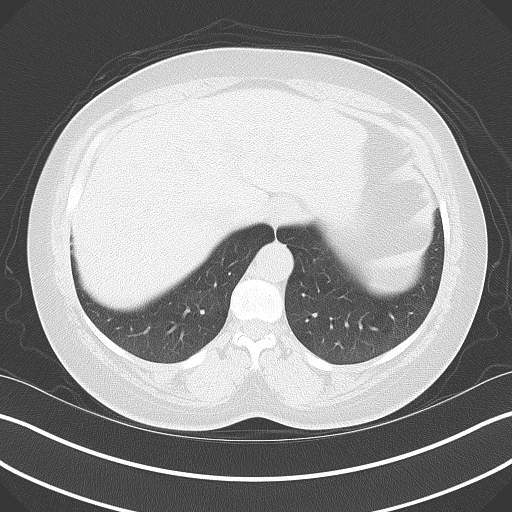
[im 18/31  soft-tissue]
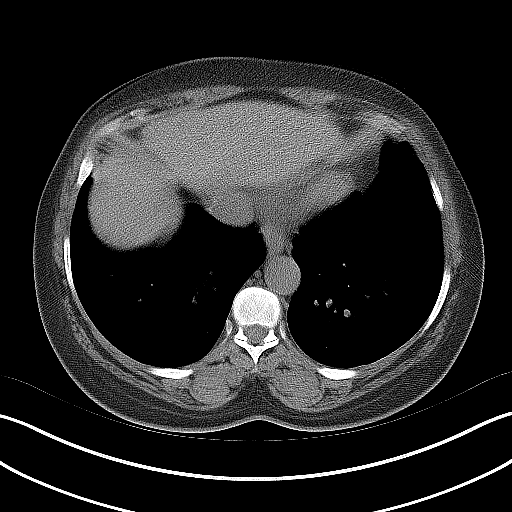
[im 18/31  lung]
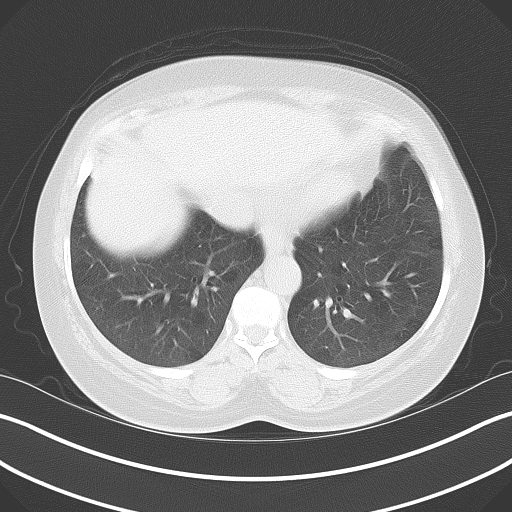
[im 22/31  soft-tissue]
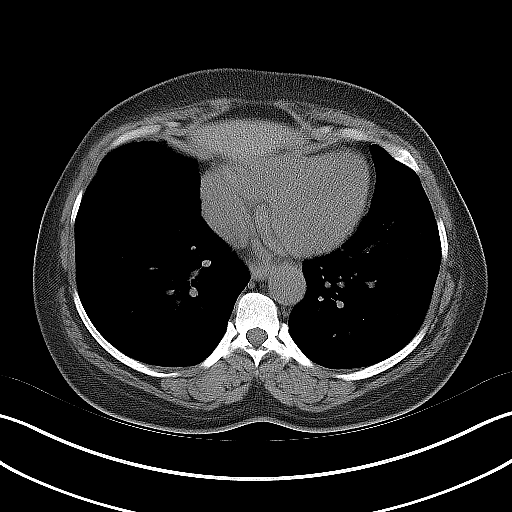
[im 22/31  lung]
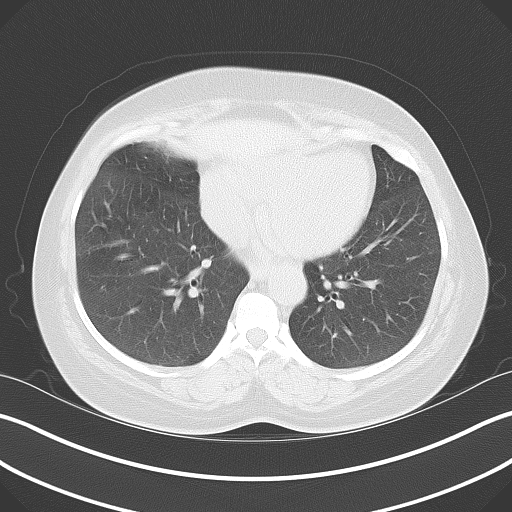
[im 26/31  soft-tissue]
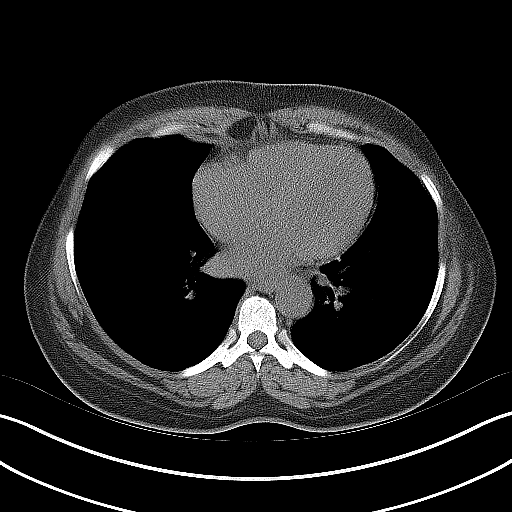
[im 26/31  lung]
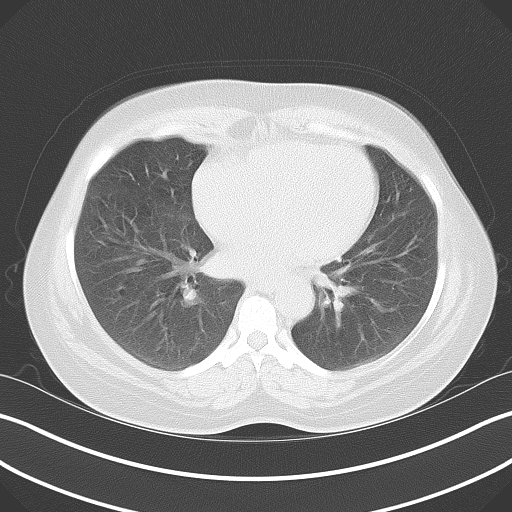

[Series 5: coronal · coronal · 0.84mm/px · 3 of 151 slices shown, 4 images]
[im 51/151  soft-tissue]
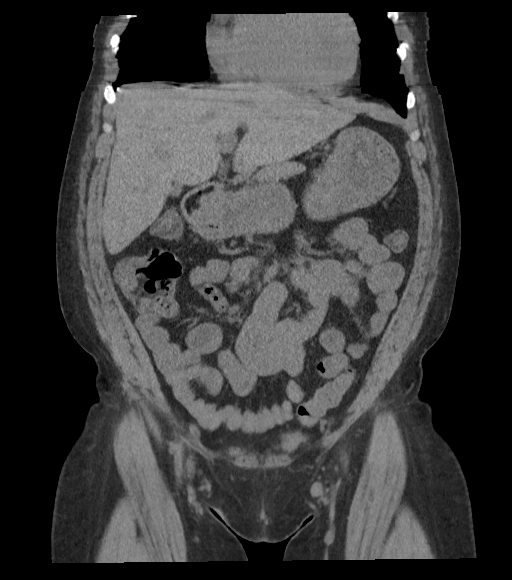
[im 67/151  soft-tissue]
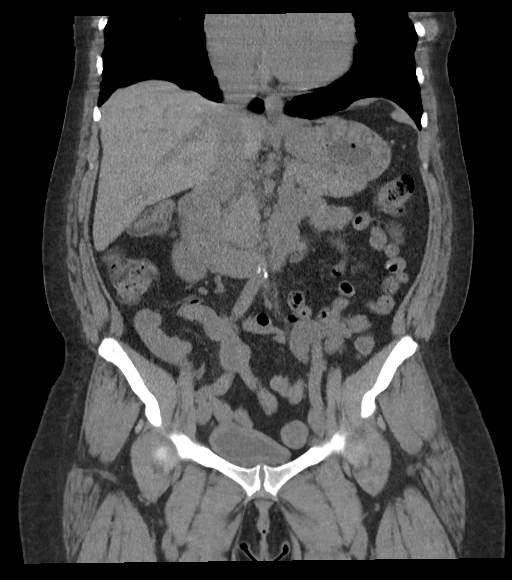
[im 67/151  bone]
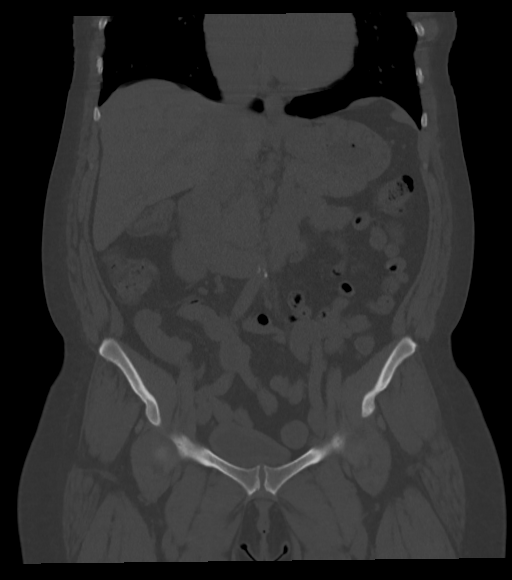
[im 84/151  soft-tissue]
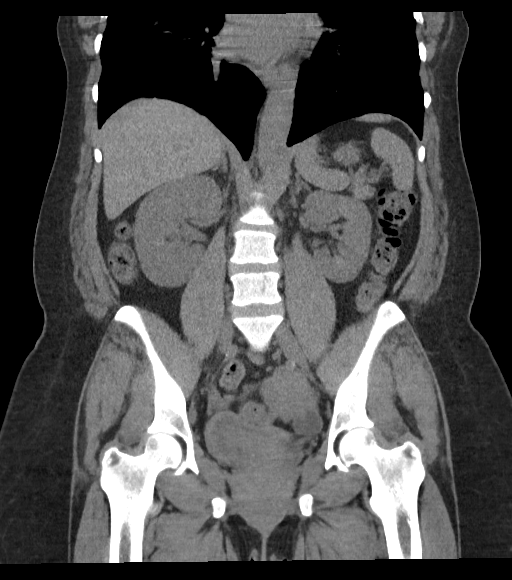

[Series 6: sagittal · sagittal · 0.66mm/px · 1 of 174 slices shown, 2 images]
[im 58/174  soft-tissue]
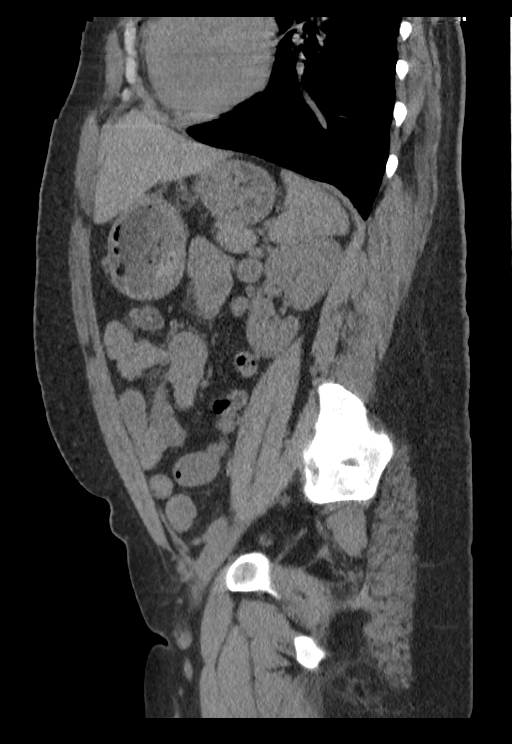
[im 58/174  bone]
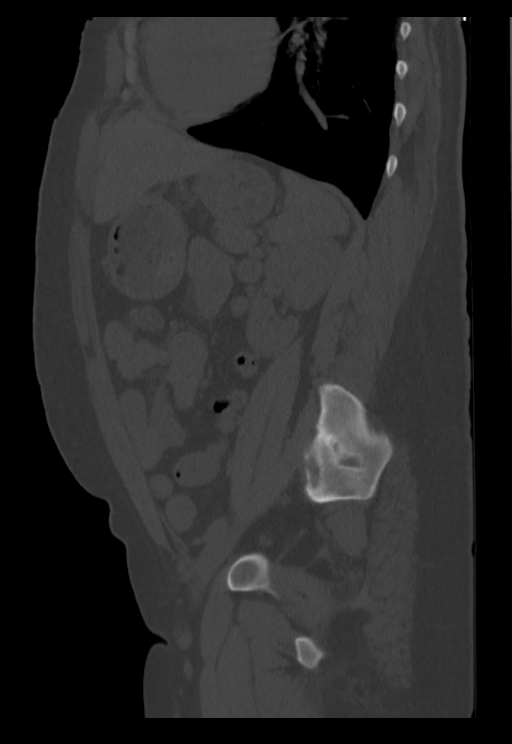

[10 of 46 positions shown; findings below may reference images not displayed]

FINDINGS: Lower chest: Clear lung bases.  Heart normal in size.

Hepatobiliary: No focal liver abnormality is seen. No gallstones,
gallbladder wall thickening, or biliary dilatation.

Pancreas: Unremarkable. No pancreatic ductal dilatation or
surrounding inflammatory changes.

Spleen: Normal in size without focal abnormality.

Adrenals/Urinary Tract: No adrenal masses.

Kidneys normal in size, orientation and position. No masses. No
convincing intrarenal stones and no hydronephrosis. Normal ureters.
Bladder wall appears prominent, but bladder is only mildly
distended. No bladder mass or stone.

Stomach/Bowel: Stomach is within normal limits. Appendix appears
normal. No evidence of bowel wall thickening, distention, or
inflammatory changes.

Vascular/Lymphatic: No aneurysm. Mild aortic atherosclerotic
calcifications. No enlarged lymph nodes.

Reproductive: Uterus and bilateral adnexa are unremarkable.

Other: Small fat containing umbilical hernia.  No ascites.

Musculoskeletal: No acute or significant osseous findings.
IMPRESSION: 1. No acute findings within the abdomen or pelvis. Findings to
account for the patient's symptoms.
2. Mild aortic atherosclerotic calcifications. No other
abnormalities.

## 2020-07-01 ENCOUNTER — Ambulatory Visit: Payer: Self-pay | Admitting: Gerontology

## 2020-07-02 ENCOUNTER — Ambulatory Visit: Payer: Self-pay | Admitting: Gastroenterology

## 2020-07-02 ENCOUNTER — Encounter: Payer: Self-pay | Admitting: Gastroenterology

## 2020-07-02 NOTE — Progress Notes (Deleted)
   Jonathon Bellows MD, MRCP(U.K) [address]  [place]  [place], [place] [address]  Main: [phone]  Fax: [phone]   Gastroenterology Consultation  Referring Provider:     Sindy Guadeloupe, MD Primary Care Physician:  Langston Reusing, NP Primary Gastroenterologist:  Dr. Jonathon Bellows  Reason for Consultation:     Iron deficiency anemia.         HPI:   Margaret Newman is a 40 y.o. y/o female who has been referred for iron deficiency anemia by Dr Janese Banks. No prior GI evaluation. On IV iron.     Rectal bleeding: *** Nose bleeds: *** Vaginal bleeding : *** Hematemesis or hemoptysis : *** Blood in urine : *** Iron/TIBC/Ferritin/ %Sat    Component Value Date/Time   IRON 19 (L) 04/29/2020 1044   TIBC 404 04/29/2020 1044   IRONPCTSAT 5 (LL) 04/29/2020 1044    Past Medical History:  Diagnosis Date  . Hypertension     Past Surgical History:  Procedure Laterality Date  . ECTOPIC PREGNANCY SURGERY  2001    Prior to Admission medications   Medication Sig Start Date End Date Taking? Authorizing Provider  amLODipine (NORVASC) 10 MG tablet Take 1 tablet (10 mg total) by mouth daily. 04/28/20   Iloabachie, Chioma E, NP  chlorthalidone (HYGROTON) 25 MG tablet Take 1 tablet (25 mg total) by mouth daily. 04/28/20   Iloabachie, Chioma E, NP  ferrous sulfate 325 (65 FE) MG tablet Take 1 tablet (325 mg total) by mouth daily with breakfast. 04/30/20   Iloabachie, Chioma E, NP    No family history on file.   Social History   Tobacco Use  . Smoking status: Current Every Day Smoker  . Smokeless tobacco: Never Used  Vaping Use  . Vaping Use: Never used  Substance Use Topics  . Alcohol use: Not Currently    Comment: occasionally  . Drug use: Yes    Types: Marijuana    Comment: used in high school.    Allergies as of 07/02/2020  . (No Known Allergies)    Review of Systems:    All systems reviewed and negative except where noted in HPI.   Physical Exam:  There were  no vitals taken for this visit. No LMP recorded. Psych:  Alert and cooperative. Normal mood and affect. General:   Alert,  Well-developed, well-nourished, pleasant and cooperative in NAD Head:  Normocephalic and atraumatic. Eyes:  Sclera clear, no icterus.   Conjunctiva pink.. Lungs:  Respirations even and unlabored.  Clear throughout to auscultation.   No wheezes, crackles, or rhonchi. No acute distress. Heart:  Regular rate and rhythm; no murmurs, clicks, rubs, or gallops. Abdomen:  Normal bowel sounds.  No bruits.  Soft, non-tender and non-distended without masses, hepatosplenomegaly or hernias noted.  No guarding or rebound tenderness.    Neurologic:  Alert and oriented x3;  grossly normal neurologically. Psych:  Alert and cooperative. Normal mood and affect.  Imaging Studies: No results found.  Assessment and Plan:   Margaret Newman is a 40 y.o. y/o female has been referred for iron deficiency anemia.  Plan  1. EGD+colonoscopy and if negative capsule study of the small bowel .  2. Check b12,folate, H pylori breath test, celiac serology.   Follow up in ***  Dr Jonathon Bellows MD,MRCP(U.K)

## 2020-07-07 ENCOUNTER — Other Ambulatory Visit: Payer: Self-pay | Admitting: *Deleted

## 2020-07-07 DIAGNOSIS — D508 Other iron deficiency anemias: Secondary | ICD-10-CM

## 2020-07-10 ENCOUNTER — Inpatient Hospital Stay: Payer: Self-pay | Attending: Oncology

## 2020-07-10 ENCOUNTER — Telehealth: Payer: Self-pay | Admitting: Oncology

## 2020-07-10 ENCOUNTER — Inpatient Hospital Stay: Payer: Self-pay | Admitting: Oncology

## 2020-07-10 NOTE — Telephone Encounter (Signed)
Call placed to pt in regards to missed appointment, left VM for pt to return call to reschedule.  

## 2020-07-15 ENCOUNTER — Telehealth: Payer: Self-pay | Admitting: Gerontology

## 2020-07-15 NOTE — Telephone Encounter (Signed)
Unable to leave message to inform patient of appointment on 2/8 at 2:00pm.

## 2020-07-21 ENCOUNTER — Ambulatory Visit: Payer: Self-pay | Admitting: Gerontology

## 2020-08-03 ENCOUNTER — Other Ambulatory Visit (HOSPITAL_COMMUNITY): Payer: Self-pay | Admitting: Internal Medicine

## 2020-08-03 DIAGNOSIS — N611 Abscess of the breast and nipple: Secondary | ICD-10-CM | POA: Diagnosis not present

## 2020-08-04 ENCOUNTER — Ambulatory Visit: Payer: Self-pay | Admitting: General Surgery

## 2020-08-04 DIAGNOSIS — N6310 Unspecified lump in the right breast, unspecified quadrant: Secondary | ICD-10-CM | POA: Diagnosis not present

## 2020-08-05 ENCOUNTER — Other Ambulatory Visit: Payer: Self-pay | Admitting: General Surgery

## 2020-08-05 ENCOUNTER — Other Ambulatory Visit: Payer: 59

## 2020-08-05 ENCOUNTER — Other Ambulatory Visit
Admission: RE | Admit: 2020-08-05 | Discharge: 2020-08-05 | Disposition: A | Discharge status: Home / Self Care | Payer: 59 | Source: Ambulatory Visit | Attending: General Surgery | Admitting: General Surgery

## 2020-08-05 ENCOUNTER — Encounter
Admission: RE | Admit: 2020-08-05 | Discharge: 2020-08-05 | Disposition: A | Discharge status: Home / Self Care | Payer: 59 | Source: Ambulatory Visit | Attending: General Surgery | Admitting: General Surgery

## 2020-08-05 ENCOUNTER — Other Ambulatory Visit: Payer: Self-pay

## 2020-08-05 ENCOUNTER — Ambulatory Visit: Payer: Self-pay | Admitting: General Surgery

## 2020-08-05 DIAGNOSIS — Z01818 Encounter for other preprocedural examination: Secondary | ICD-10-CM | POA: Insufficient documentation

## 2020-08-05 DIAGNOSIS — Z01810 Encounter for preprocedural cardiovascular examination: Secondary | ICD-10-CM | POA: Diagnosis not present

## 2020-08-05 DIAGNOSIS — Z20822 Contact with and (suspected) exposure to covid-19: Secondary | ICD-10-CM | POA: Insufficient documentation

## 2020-08-05 HISTORY — DX: Unspecified asthma, uncomplicated: J45.909

## 2020-08-05 LAB — SARS CORONAVIRUS 2 (TAT 6-24 HRS): SARS Coronavirus 2: NEGATIVE

## 2020-08-05 LAB — POTASSIUM: Potassium: 3.6 mmol/L (ref 3.5–5.1)

## 2020-08-05 NOTE — Patient Instructions (Signed)
Your procedure is scheduled on:08-07-20 FRIDAY Report to the Registration Desk on the 1st floor of the Medical Mall-Then proceed to the 2nd floor Surgery Desk in the Medical mall To find out your arrival time, please call [phone] between 1PM - 3PM on:08-06-20 THURSDAY  REMEMBER: Instructions that are not followed completely may result in serious medical risk, up to and including death; or upon the discretion of your surgeon and anesthesiologist your surgery may need to be rescheduled.  Do not eat food after midnight the night before surgery.  No gum chewing, lozengers or hard candies.  You may however, drink CLEAR liquids up to 2 hours before you are scheduled to arrive for your surgery. Do not drink anything within 2 hours of your scheduled arrival time.  Clear liquids include: - water  - apple juice without pulp - gatorade  - black coffee or tea (Do NOT add milk or creamers to the coffee or tea) Do NOT drink anything that is not on this list.  TAKE THESE MEDICATIONS THE MORNING OF SURGERY WITH A SIP OF WATER: -NORVASC (AMLODIPINE)  One week prior to surgery: Stop Anti-inflammatories (NSAIDS) such as Advil, Aleve, Ibuprofen, Motrin, Naproxen, Naprosyn and Aspirin based products such as Excedrin, Goodys Powder, BC Powder-OK TO TAKE TYLENOL/HYDROCODONE IF NEEDED  Stop ANY OVER THE COUNTER supplements until after surgery.  No Alcohol for 24 hours before or after surgery.  No Smoking including e-cigarettes for 24 hours prior to surgery.  No chewable tobacco products for at least 6 hours prior to surgery.  No nicotine patches on the day of surgery.  Do not use any "recreational" drugs for at least a week prior to your surgery.  Please be advised that the combination of cocaine and anesthesia may have negative outcomes, up to and including death. If you test positive for cocaine, your surgery will be cancelled.  On the morning of surgery brush your teeth with toothpaste and  water, you may rinse your mouth with mouthwash if you wish. Do not swallow any toothpaste or mouthwash.  Do not wear jewelry, make-up, hairpins, clips or nail polish.  Do not wear lotions, powders, or perfumes.   Do not shave body from the neck down 48 hours prior to surgery just in case you cut yourself which could leave a site for infection.  Also, freshly shaved skin may become irritated if using the CHG soap.  Contact lenses, hearing aids and dentures may not be worn into surgery.  Do not bring valuables to the hospital. [facility] is not responsible for any missing/lost belongings or valuables.   Use CHG Soap as directed on instruction sheet.  Notify your doctor if there is any change in your medical condition (cold, fever, infection).  Wear comfortable clothing (specific to your surgery type) to the hospital.  Plan for stool softeners for home use; pain medications have a tendency to cause constipation. You can also help prevent constipation by eating foods high in fiber such as fruits and vegetables and drinking plenty of fluids as your diet allows.  After surgery, you can help prevent lung complications by doing breathing exercises.  Take deep breaths and cough every 1-2 hours. Your doctor may order a device called an Incentive Spirometer to help you take deep breaths. When coughing or sneezing, hold a pillow firmly against your incision with both hands. This is called "splinting." Doing this helps protect your incision. It also decreases belly discomfort.  If you are being admitted to the hospital  overnight, leave your suitcase in the car. After surgery it may be brought to your room.  If you are being discharged the day of surgery, you will not be allowed to drive home. You will need a responsible adult (18 years or older) to drive you home and stay with you that night.   If you are taking public transportation, you will need to have a responsible adult (18 years or older)  with you. Please confirm with your physician that it is acceptable to use public transportation.   Please call the [facility] at [phone] if you have any questions about these instructions.  Visitation Policy:  Patients undergoing a surgery or procedure may have one family member or support person with them as long as that person is not COVID-19 positive or experiencing its symptoms.  That person may remain in the waiting area during the procedure.  Inpatient Visitation:    Visiting hours are 7 a.m. to 8 p.m. Patients will be allowed one visitor. The visitor may change daily. The visitor must pass COVID-19 screenings, use hand sanitizer when entering and exiting the patient's room and wear a mask at all times, including in the patient's room. Patients must also wear a mask when staff or their visitor are in the room. Masking is required regardless of vaccination status. Systemwide, no visitors 17 or younger.  Visitation Policy Changes: The following changes are to take effect on Feb. 28 at 7 a.m.  No visitors under the age of 77. Any visitor under the age of 5 must be accompanied by an adult. Adult inpatients: Two visitors will be allowed daily and the visitors may change each day during the patient's stay. (Please note -- no changes at this time for the [facility] and inpatients, Emergency Departments, Ambulatory Sites, [facility], medical practices and procedural areas.)

## 2020-08-05 NOTE — H&P (Signed)
PATIENT PROFILE: Margaret Newman is a 40 y.o. female who presents to the Clinic for consultation at the request of Dr. Elston for evaluation of right breast abscess.  PCP:  None  HISTORY OF PRESENT ILLNESS: Margaret Newman reports she is dealing with a recurrent mass or abscess on the right breast.  She reported that he has been very painful since a week ago.  He has been getting worse.  Today she reports pain 10 out of 10.  She denies any drainage.  She denies any fever or chills.  Patient was evaluated by urgent care and she was started on antibiotic therapy.  Patient denies any previous history of breast cancer.  Patient reports paternal grand mother asked previous family history of breast cancer.  Patient reports that she had the same issue 2 years ago and was treated with incision and drainage but it came back immediately.  She continued palpating a mass on her right breast.   PROBLEM LIST: Problem List  Date Reviewed: 08/03/2020         Noted   Prediabetes 02/06/2020   Hypertension 05/11/2019      GENERAL REVIEW OF SYSTEMS:   General ROS: negative for - chills, fatigue, fever, weight gain or weight loss Allergy and Immunology ROS: negative for - hives  Hematological and Lymphatic ROS: negative for - bleeding problems or bruising, negative for palpable nodes Endocrine ROS: negative for - heat or cold intolerance, hair changes Respiratory ROS: negative for - cough, shortness of breath or wheezing Cardiovascular ROS: no chest pain or palpitations GI ROS: negative for nausea, vomiting, abdominal pain, diarrhea, constipation Musculoskeletal ROS: negative for - joint swelling or muscle pain Neurological ROS: negative for - confusion, syncope Dermatological ROS: negative for pruritus and rash Psychiatric: negative for anxiety, depression, difficulty sleeping and memory loss  MEDICATIONS: Current Outpatient Medications  Medication Sig Dispense Refill  . amLODIPine (NORVASC) 10 MG  tablet Take 1 tablet by mouth once daily    . amoxicillin-clavulanate (AUGMENTIN) 875-125 mg tablet Take 1 tablet (875 mg total) by mouth 2 (two) times daily for 10 days 20 tablet 0  . chlorthalidone 25 MG tablet Take 1 tablet by mouth once daily    . HYDROcodone-acetaminophen (NORCO) 5-325 mg tablet Take 1 tablet by mouth every 6 (six) hours as needed for Pain for up to 20 doses 20 tablet 0   No current facility-administered medications for this visit.    ALLERGIES: Patient has no known allergies.  PAST MEDICAL HISTORY: Past Medical History:  Diagnosis Date  . Hypertension     PAST SURGICAL HISTORY: History reviewed. No pertinent surgical history.   FAMILY HISTORY: Family History  Problem Relation Age of Onset  . High blood pressure (Hypertension) Mother   . Diabetes Maternal Aunt   . Dementia Maternal Grandmother   . High blood pressure (Hypertension) Maternal Grandmother   . Breast cancer Paternal Grandmother      SOCIAL HISTORY: Social History   Socioeconomic History  . Marital status: Single    Spouse name: Not on file  . Number of children: Not on file  . Years of education: Not on file  . Highest education level: Not on file  Occupational History  . Not on file  Tobacco Use  . Smoking status: Current Every Day Smoker  . Smokeless tobacco: Never Used  Vaping Use  . Vaping Use: Never used  Substance and Sexual Activity  . Alcohol use: Never  . Drug use: Never  . Sexual activity:   Not on file  Other Topics Concern  . Not on file  Social History Narrative  . Not on file   Social Determinants of Health   Financial Resource Strain: Not on file  Food Insecurity: Not on file  Transportation Needs: Not on file    PHYSICAL EXAM: Vitals:   08/04/20 1052  BP: (!) 150/105  Pulse: 89   Body mass index is 35.25 kg/m. Weight: 90.3 kg (199 lb)   GENERAL: Alert, active, oriented x3  HEENT: Pupils equal reactive to light. Extraocular movements are intact.  Sclera clear. Palpebral conjunctiva normal red color.Pharynx clear.  NECK: Supple with no palpable mass and no adenopathy.  LUNGS: Sound clear with no rales rhonchi or wheezes.  HEART: Regular rhythm S1 and S2 without murmur.  BREAST: Right breast palpable mass at the 3:00 nipple areolar complex.  There is erythema.  There is tenderness to palpation.  The area is warm.  There is no palpable mass, no axillary adenopathy.  Left breast without any palpable mass, skin changes, axillary adenopathy.  ABDOMEN: Soft and depressible, nontender with no palpable mass, no hepatomegaly.   EXTREMITIES: Well-developed well-nourished symmetrical with no dependent edema.  NEUROLOGICAL: Awake alert oriented, facial expression symmetrical, moving all extremities.  REVIEW OF DATA: I have reviewed the following data today: No results found for any previous visit.     ASSESSMENT: Margaret Newman is a 40 y.o. female presenting for consultation for right breast mass.  Patient with a very tender right breast mass this could be an infected fibroadenoma versus infected cyst.  Due to the tenderness and redness I do not think that the patient can have any imaging at this moment.  I will proceed with excision of the right breast mass as a therapeutic and diagnostic purposes.  I discussed with the patient the risk of surgery that includes bleeding, infection, pain, scar, among others.  The patient reports understood and agreed to proceed.  Breast mass, right [N63.10]  PLAN: 1. Continue antibiotic therapy 2. Will schedule surgery for excision of right breast mass (19120) or cyst 3. May use warm compresses on the affected area 4. Contact us if you have any concern.   Patient verbalized understanding, all questions were answered, and were agreeable with the plan outlined above.     Herbert Pun, MD  Electronically signed by Herbert Pun, MD

## 2020-08-05 NOTE — H&P (View-Only) (Signed)
PATIENT PROFILE: Kamaria Lucia is a 40 y.o. female who presents to the Clinic for consultation at the request of Dr. Duanne Moron for evaluation of right breast abscess.  PCP:  None  HISTORY OF PRESENT ILLNESS: Ms. Lampton reports she is dealing with a recurrent mass or abscess on the right breast.  She reported that he has been very painful since a week ago.  He has been getting worse.  Today she reports pain 10 out of 10.  She denies any drainage.  She denies any fever or chills.  Patient was evaluated by urgent care and she was started on antibiotic therapy.  Patient denies any previous history of breast cancer.  Patient reports paternal grand mother asked previous family history of breast cancer.  Patient reports that she had the same issue 2 years ago and was treated with incision and drainage but it came back immediately.  She continued palpating a mass on her right breast.   PROBLEM LIST: Problem List  Date Reviewed: 08/03/2020         Noted   Prediabetes 02/06/2020   Hypertension 05/11/2019      GENERAL REVIEW OF SYSTEMS:   General ROS: negative for - chills, fatigue, fever, weight gain or weight loss Allergy and Immunology ROS: negative for - hives  Hematological and Lymphatic ROS: negative for - bleeding problems or bruising, negative for palpable nodes Endocrine ROS: negative for - heat or cold intolerance, hair changes Respiratory ROS: negative for - cough, shortness of breath or wheezing Cardiovascular ROS: no chest pain or palpitations GI ROS: negative for nausea, vomiting, abdominal pain, diarrhea, constipation Musculoskeletal ROS: negative for - joint swelling or muscle pain Neurological ROS: negative for - confusion, syncope Dermatological ROS: negative for pruritus and rash Psychiatric: negative for anxiety, depression, difficulty sleeping and memory loss  MEDICATIONS: Current Outpatient Medications  Medication Sig Dispense Refill  . amLODIPine (NORVASC) 10 MG  tablet Take 1 tablet by mouth once daily    . amoxicillin-clavulanate (AUGMENTIN) 875-125 mg tablet Take 1 tablet (875 mg total) by mouth 2 (two) times daily for 10 days 20 tablet 0  . chlorthalidone 25 MG tablet Take 1 tablet by mouth once daily    . HYDROcodone-acetaminophen (NORCO) 5-325 mg tablet Take 1 tablet by mouth every 6 (six) hours as needed for Pain for up to 20 doses 20 tablet 0   No current facility-administered medications for this visit.    ALLERGIES: Patient has no known allergies.  PAST MEDICAL HISTORY: Past Medical History:  Diagnosis Date  . Hypertension     PAST SURGICAL HISTORY: History reviewed. No pertinent surgical history.   FAMILY HISTORY: Family History  Problem Relation Age of Onset  . High blood pressure (Hypertension) Mother   . Diabetes Maternal Aunt   . Dementia Maternal Grandmother   . High blood pressure (Hypertension) Maternal Grandmother   . Breast cancer Paternal Grandmother      SOCIAL HISTORY: Social History   Socioeconomic History  . Marital status: Single    Spouse name: Not on file  . Number of children: Not on file  . Years of education: Not on file  . Highest education level: Not on file  Occupational History  . Not on file  Tobacco Use  . Smoking status: Current Every Day Smoker  . Smokeless tobacco: Never Used  Vaping Use  . Vaping Use: Never used  Substance and Sexual Activity  . Alcohol use: Never  . Drug use: Never  . Sexual activity:  Not on file  Other Topics Concern  . Not on file  Social History Narrative  . Not on file   Social Determinants of Health   Financial Resource Strain: Not on file  Food Insecurity: Not on file  Transportation Needs: Not on file    PHYSICAL EXAM: Vitals:   08/04/20 1052  BP: (!) 150/105  Pulse: 89   Body mass index is 35.25 kg/m. Weight: 90.3 kg (199 lb)   GENERAL: Alert, active, oriented x3  HEENT: Pupils equal reactive to light. Extraocular movements are intact.  Sclera clear. Palpebral conjunctiva normal red color.Pharynx clear.  NECK: Supple with no palpable mass and no adenopathy.  LUNGS: Sound clear with no rales rhonchi or wheezes.  HEART: Regular rhythm S1 and S2 without murmur.  BREAST: Right breast palpable mass at the 3:00 nipple areolar complex.  There is erythema.  There is tenderness to palpation.  The area is warm.  There is no palpable mass, no axillary adenopathy.  Left breast without any palpable mass, skin changes, axillary adenopathy.  ABDOMEN: Soft and depressible, nontender with no palpable mass, no hepatomegaly.   EXTREMITIES: Well-developed well-nourished symmetrical with no dependent edema.  NEUROLOGICAL: Awake alert oriented, facial expression symmetrical, moving all extremities.  REVIEW OF DATA: I have reviewed the following data today: No results found for any previous visit.     ASSESSMENT: Ms. Hollman is a 40 y.o. female presenting for consultation for right breast mass.  Patient with a very tender right breast mass this could be an infected fibroadenoma versus infected cyst.  Due to the tenderness and redness I do not think that the patient can have any imaging at this moment.  I will proceed with excision of the right breast mass as a therapeutic and diagnostic purposes.  I discussed with the patient the risk of surgery that includes bleeding, infection, pain, scar, among others.  The patient reports understood and agreed to proceed.  Breast mass, right [N63.10]  PLAN: 1. Continue antibiotic therapy 2. Will schedule surgery for excision of right breast mass (19120) or cyst 3. May use warm compresses on the affected area 4. Contact us if you have any concern.   Patient verbalized understanding, all questions were answered, and were agreeable with the plan outlined above.     Herbert Pun, MD  Electronically signed by Herbert Pun, MD

## 2020-08-06 ENCOUNTER — Other Ambulatory Visit: Payer: Self-pay | Admitting: Oncology

## 2020-08-07 ENCOUNTER — Encounter: Payer: Self-pay | Admitting: General Surgery

## 2020-08-07 ENCOUNTER — Encounter
Admission: RE | Disposition: A | Discharge status: Home / Self Care | Payer: Self-pay | Source: Home / Self Care | Attending: General Surgery

## 2020-08-07 ENCOUNTER — Ambulatory Visit
Admission: RE | Admit: 2020-08-07 | Discharge: 2020-08-07 | Disposition: A | Discharge status: Home / Self Care | Payer: 59 | Attending: General Surgery | Admitting: General Surgery

## 2020-08-07 ENCOUNTER — Ambulatory Visit: Payer: 59 | Admitting: Anesthesiology

## 2020-08-07 ENCOUNTER — Other Ambulatory Visit: Payer: Self-pay

## 2020-08-07 ENCOUNTER — Other Ambulatory Visit (HOSPITAL_COMMUNITY): Payer: Self-pay | Admitting: General Surgery

## 2020-08-07 DIAGNOSIS — Z79899 Other long term (current) drug therapy: Secondary | ICD-10-CM | POA: Diagnosis not present

## 2020-08-07 DIAGNOSIS — I10 Essential (primary) hypertension: Secondary | ICD-10-CM | POA: Diagnosis not present

## 2020-08-07 DIAGNOSIS — N611 Abscess of the breast and nipple: Secondary | ICD-10-CM | POA: Insufficient documentation

## 2020-08-07 DIAGNOSIS — D241 Benign neoplasm of right breast: Secondary | ICD-10-CM | POA: Diagnosis not present

## 2020-08-07 DIAGNOSIS — F17200 Nicotine dependence, unspecified, uncomplicated: Secondary | ICD-10-CM | POA: Insufficient documentation

## 2020-08-07 DIAGNOSIS — D509 Iron deficiency anemia, unspecified: Secondary | ICD-10-CM | POA: Diagnosis not present

## 2020-08-07 DIAGNOSIS — N6310 Unspecified lump in the right breast, unspecified quadrant: Secondary | ICD-10-CM | POA: Diagnosis not present

## 2020-08-07 DIAGNOSIS — R7303 Prediabetes: Secondary | ICD-10-CM | POA: Diagnosis not present

## 2020-08-07 HISTORY — PX: EXCISION OF BREAST BIOPSY: SHX5822

## 2020-08-07 HISTORY — PX: BREAST BIOPSY: SHX20

## 2020-08-07 LAB — URINE DRUG SCREEN, QUALITATIVE (ARMC ONLY)
Amphetamines, Ur Screen: NOT DETECTED
Barbiturates, Ur Screen: NOT DETECTED
Benzodiazepine, Ur Scrn: NOT DETECTED
Cannabinoid 50 Ng, Ur ~~LOC~~: POSITIVE — AB
Cocaine Metabolite,Ur ~~LOC~~: NOT DETECTED
MDMA (Ecstasy)Ur Screen: NOT DETECTED
Methadone Scn, Ur: NOT DETECTED
Opiate, Ur Screen: POSITIVE — AB
Phencyclidine (PCP) Ur S: NOT DETECTED
Tricyclic, Ur Screen: NOT DETECTED

## 2020-08-07 LAB — POCT PREGNANCY, URINE: Preg Test, Ur: NEGATIVE

## 2020-08-07 SURGERY — EXCISION OF BREAST BIOPSY
Anesthesia: General | Site: Breast | Laterality: Right

## 2020-08-07 MED ORDER — CHLORHEXIDINE GLUCONATE 0.12 % MT SOLN
OROMUCOSAL | Status: AC
  Administered 2020-08-07: 15 mL via OROMUCOSAL
  Filled 2020-08-07: qty 15

## 2020-08-07 MED ORDER — DEXAMETHASONE SODIUM PHOSPHATE 10 MG/ML IJ SOLN
INTRAMUSCULAR | Status: DC | PRN
  Administered 2020-08-07: 10 mg via INTRAVENOUS

## 2020-08-07 MED ORDER — HYDROCODONE-ACETAMINOPHEN 5-325 MG PO TABS: 1.0000 | ORAL_TABLET | ORAL | 0 refills | Status: AC | PRN

## 2020-08-07 MED ORDER — PROPOFOL 10 MG/ML IV BOLUS
INTRAVENOUS | Status: AC
  Filled 2020-08-07: qty 20

## 2020-08-07 MED ORDER — HYDROGEN PEROXIDE 3 % EX SOLN
CUTANEOUS | Status: DC | PRN
  Administered 2020-08-07: 1 via TOPICAL

## 2020-08-07 MED ORDER — FAMOTIDINE 20 MG PO TABS
ORAL_TABLET | ORAL | Status: AC
  Administered 2020-08-07: 20 mg via ORAL
  Filled 2020-08-07: qty 1

## 2020-08-07 MED ORDER — PROPOFOL 10 MG/ML IV BOLUS
INTRAVENOUS | Status: DC | PRN
  Administered 2020-08-07: 50 mg via INTRAVENOUS
  Administered 2020-08-07: 150 mg via INTRAVENOUS

## 2020-08-07 MED ORDER — BUPIVACAINE HCL (PF) 0.5 % IJ SOLN
INTRAMUSCULAR | Status: AC
  Filled 2020-08-07: qty 30

## 2020-08-07 MED ORDER — FENTANYL CITRATE (PF) 100 MCG/2ML IJ SOLN
INTRAMUSCULAR | Status: DC | PRN
  Administered 2020-08-07 (×2): 50 ug via INTRAVENOUS

## 2020-08-07 MED ORDER — FENTANYL CITRATE (PF) 100 MCG/2ML IJ SOLN
INTRAMUSCULAR | Status: AC
  Filled 2020-08-07: qty 2

## 2020-08-07 MED ORDER — CEFAZOLIN SODIUM-DEXTROSE 2-4 GM/100ML-% IV SOLN
2.0000 g | INTRAVENOUS | Status: AC
  Administered 2020-08-07: 2 g via INTRAVENOUS

## 2020-08-07 MED ORDER — HYDROCODONE-ACETAMINOPHEN 5-325 MG PO TABS
ORAL_TABLET | ORAL | Status: AC
  Filled 2020-08-07: qty 1

## 2020-08-07 MED ORDER — SUCCINYLCHOLINE CHLORIDE 20 MG/ML IJ SOLN
INTRAMUSCULAR | Status: DC | PRN
  Administered 2020-08-07: 100 mg via INTRAVENOUS

## 2020-08-07 MED ORDER — CHLORHEXIDINE GLUCONATE 0.12 % MT SOLN: 15.0000 mL | Freq: Once | OROMUCOSAL | Status: AC

## 2020-08-07 MED ORDER — FENTANYL CITRATE (PF) 100 MCG/2ML IJ SOLN
25.0000 ug | INTRAMUSCULAR | Status: DC | PRN
  Administered 2020-08-07 (×3): 25 ug via INTRAVENOUS

## 2020-08-07 MED ORDER — SUGAMMADEX SODIUM 200 MG/2ML IV SOLN
INTRAVENOUS | Status: DC | PRN
  Administered 2020-08-07: 200 mg via INTRAVENOUS

## 2020-08-07 MED ORDER — HEMOSTATIC AGENTS (NO CHARGE) OPTIME
TOPICAL | Status: DC | PRN
  Administered 2020-08-07: 1 via TOPICAL

## 2020-08-07 MED ORDER — ACETAMINOPHEN 10 MG/ML IV SOLN
INTRAVENOUS | Status: AC
  Filled 2020-08-07: qty 100

## 2020-08-07 MED ORDER — LIDOCAINE HCL (CARDIAC) PF 100 MG/5ML IV SOSY
PREFILLED_SYRINGE | INTRAVENOUS | Status: DC | PRN
  Administered 2020-08-07: 100 mg via INTRAVENOUS

## 2020-08-07 MED ORDER — FENTANYL CITRATE (PF) 100 MCG/2ML IJ SOLN
INTRAMUSCULAR | Status: AC
  Administered 2020-08-07: 25 ug via INTRAVENOUS
  Filled 2020-08-07: qty 2

## 2020-08-07 MED ORDER — HYDROCODONE-ACETAMINOPHEN 5-325 MG PO TABS
1.0000 | ORAL_TABLET | Freq: Once | ORAL | Status: AC
  Administered 2020-08-07: 1 via ORAL

## 2020-08-07 MED ORDER — AMOXICILLIN-POT CLAVULANATE 875-125 MG PO TABS: 1.0000 | ORAL_TABLET | Freq: Two times a day (BID) | ORAL | 0 refills | Status: AC

## 2020-08-07 MED ORDER — GLYCOPYRROLATE 0.2 MG/ML IJ SOLN
INTRAMUSCULAR | Status: DC | PRN
  Administered 2020-08-07: .2 mg via INTRAVENOUS

## 2020-08-07 MED ORDER — ACETAMINOPHEN 10 MG/ML IV SOLN
INTRAVENOUS | Status: DC | PRN
  Administered 2020-08-07: 1000 mg via INTRAVENOUS

## 2020-08-07 MED ORDER — LACTATED RINGERS IV SOLN: INTRAVENOUS | Status: DC

## 2020-08-07 MED ORDER — CEFAZOLIN SODIUM-DEXTROSE 2-4 GM/100ML-% IV SOLN
INTRAVENOUS | Status: AC
  Filled 2020-08-07: qty 100

## 2020-08-07 MED ORDER — FAMOTIDINE 20 MG PO TABS: 20.0000 mg | ORAL_TABLET | Freq: Once | ORAL | Status: AC

## 2020-08-07 MED ORDER — ONDANSETRON HCL 4 MG/2ML IJ SOLN
INTRAMUSCULAR | Status: DC | PRN
  Administered 2020-08-07: 4 mg via INTRAVENOUS

## 2020-08-07 MED ORDER — MIDAZOLAM HCL 2 MG/2ML IJ SOLN
INTRAMUSCULAR | Status: DC | PRN
  Administered 2020-08-07: 2 mg via INTRAVENOUS

## 2020-08-07 MED ORDER — MIDAZOLAM HCL 2 MG/2ML IJ SOLN
INTRAMUSCULAR | Status: AC
  Filled 2020-08-07: qty 2

## 2020-08-07 MED ORDER — ORAL CARE MOUTH RINSE: 15.0000 mL | Freq: Once | OROMUCOSAL | Status: AC

## 2020-08-07 MED ORDER — ONDANSETRON HCL 4 MG/2ML IJ SOLN: 4.0000 mg | Freq: Once | INTRAMUSCULAR | Status: DC | PRN

## 2020-08-07 SURGICAL SUPPLY — 46 items
ADH SKN CLS APL DERMABOND .7 (GAUZE/BANDAGES/DRESSINGS) ×1
APL PRP STRL LF DISP 70% ISPRP (MISCELLANEOUS) ×1
BLADE SURG 15 STRL LF DISP TIS (BLADE) ×1 IMPLANT
BLADE SURG 15 STRL SS (BLADE) ×2
CANISTER SUCT 1200ML W/VALVE (MISCELLANEOUS) ×2 IMPLANT
CHLORAPREP W/TINT 26 (MISCELLANEOUS) ×2 IMPLANT
CNTNR SPEC 2.5X3XGRAD LEK (MISCELLANEOUS) ×1
CONT SPEC 4OZ STER OR WHT (MISCELLANEOUS) ×1
CONT SPEC 4OZ STRL OR WHT (MISCELLANEOUS) ×1
CONTAINER SPEC 2.5X3XGRAD LEK (MISCELLANEOUS) ×1 IMPLANT
COVER WAND RF STERILE (DRAPES) ×2 IMPLANT
DERMABOND ADVANCED (GAUZE/BANDAGES/DRESSINGS) ×1
DERMABOND ADVANCED .7 DNX12 (GAUZE/BANDAGES/DRESSINGS) ×1 IMPLANT
DEVICE DUBIN SPECIMEN MAMMOGRA (MISCELLANEOUS) ×2 IMPLANT
DRAPE LAPAROTOMY TRNSV 106X77 (MISCELLANEOUS) ×2 IMPLANT
ELECT CAUTERY BLADE TIP 2.5 (TIP) ×2
ELECT REM PT RETURN 9FT ADLT (ELECTROSURGICAL) ×2
ELECTRODE CAUTERY BLDE TIP 2.5 (TIP) ×1 IMPLANT
ELECTRODE REM PT RTRN 9FT ADLT (ELECTROSURGICAL) ×1 IMPLANT
GAUZE PACKING IODOFORM 1X5 (PACKING) ×2 IMPLANT
GLOVE SURG ENC MOIS LTX SZ6.5 (GLOVE) ×2 IMPLANT
GLOVE SURG UNDER POLY LF SZ6.5 (GLOVE) ×2 IMPLANT
GOWN STRL REUS W/ TWL LRG LVL3 (GOWN DISPOSABLE) ×3 IMPLANT
GOWN STRL REUS W/TWL LRG LVL3 (GOWN DISPOSABLE) ×6
HEMOSTAT ARISTA ABSORB 1G (HEMOSTASIS) ×2 IMPLANT
KIT MARKER MARGIN INK (KITS) IMPLANT
KIT TURNOVER KIT A (KITS) ×2 IMPLANT
LABEL OR SOLS (LABEL) ×2 IMPLANT
MANIFOLD NEPTUNE II (INSTRUMENTS) ×2 IMPLANT
MARGIN MAP 10MM (MISCELLANEOUS) ×2 IMPLANT
MARKER MARGIN CORRECT CLIP (MARKER) IMPLANT
NEEDLE HYPO 25X1 1.5 SAFETY (NEEDLE) ×2 IMPLANT
PACK BASIN MINOR ARMC (MISCELLANEOUS) ×2 IMPLANT
RETRACTOR RING XSMALL (MISCELLANEOUS) IMPLANT
RTRCTR WOUND ALEXIS 13CM XS SH (MISCELLANEOUS)
SUT ETHILON 3-0 FS-10 30 BLK (SUTURE) ×2
SUT MNCRL 4-0 (SUTURE) ×2
SUT MNCRL 4-0 27XMFL (SUTURE) ×1
SUT SILK 2 0 SH (SUTURE) ×2 IMPLANT
SUT VIC AB 3-0 SH 27 (SUTURE) ×4
SUT VIC AB 3-0 SH 27X BRD (SUTURE) ×2 IMPLANT
SUTURE EHLN 3-0 FS-10 30 BLK (SUTURE) ×1 IMPLANT
SUTURE MNCRL 4-0 27XMF (SUTURE) ×1 IMPLANT
SYR 10ML LL (SYRINGE) ×2 IMPLANT
SYR BULB IRRIG 60ML STRL (SYRINGE) ×2 IMPLANT
WATER STERILE IRR 1000ML POUR (IV SOLUTION) ×2 IMPLANT

## 2020-08-07 NOTE — Discharge Instructions (Addendum)
  Diet: Resume home heart healthy regular diet.   Wound care: Remove dressing tomorrow. Once dressing removed, remove the packing and then you may shower with soapy water and pat dry (do not rub incisions), but no baths or submerging incision underwater until follow-up. (no swimming)  Use a plain gauze to pack the wound again. Just need to insert a small portion of the gauze to keep the wound open.    Medications: Resume all home medications. For mild to moderate pain: acetaminophen (Tylenol) or ibuprofen (if no kidney disease). Combining Tylenol with alcohol can substantially increase your risk of causing liver disease. Narcotic pain medications, if prescribed, can be used for severe pain, though may cause nausea, constipation, and drowsiness. Do not combine Tylenol and Norco within a 6 hour period as Norco contains Tylenol. If you do not need the narcotic pain medication, you do not need to fill the prescription.  Take antibiotic therapy as prescribed.  Call office [phone]) at any time if any questions, worsening pain, fevers/chills, bleeding, drainage from incision site, or other concerns.   AMBULATORY SURGERY  DISCHARGE INSTRUCTIONS   1) The drugs that you were given will stay in your system until tomorrow so for the next 24 hours you should not:  A) Drive an automobile B) Make any legal decisions C) Drink any alcoholic beverage   2) You may resume regular meals tomorrow.  Today it is better to start with liquids and gradually work up to solid foods.  You may eat anything you prefer, but it is better to start with liquids, then soup and crackers, and gradually work up to solid foods.   3) Please notify your doctor immediately if you have any unusual bleeding, trouble breathing, redness and pain at the surgery site, drainage, fever, or pain not relieved by medication.    4) Additional Instructions:        Please contact your physician with any problems or Same Day  Surgery at [phone], Monday through Friday 6 am to 4 pm, or  at [facility] number at [phone].

## 2020-08-07 NOTE — Op Note (Signed)
Preoperative diagnosis: Right breast mass.  Postoperative diagnosis: Right breast infected mass.  Procedure: Right breast excisional biopsy.  Anesthesia: GETA  Surgeon: Dr. Windell Moment  Wound Classification: Clean  Indications:  Patient is a 40 y.o. female with a palpable right breast mass that was very tender to palpation. Unable to perform images due to pain and suspicious of infection.    Findings: 1. Palpable mass at 3 o'clock position 2. Abundant amount of pus identified 3. No other abnormality identified.   Description of procedure: The patient was taken to the operating room and placed supine on the operating table, and after general anesthesia was administered, the right chest was prepped and draped in the usual sterile fashion. A time-out was completed verifying correct patient, procedure, site, positioning, and implant(s) and/or special equipment prior to beginning this procedure.  A circumareolar skin incision incision was planned adjacent to the palpable mass. Flaps were raised and the location of the mass confirmed by palpation.  A 2-0 silk figure-of-eight stay suture was placed in the mass and used to retract.  The mass was dissected from surrounding tissues using electrocautery. Abundant amount of pus was identified and suctioned.  After removing the lump, the cavity was palpated and no additional abnormalities was palpated. The specimen was submitted to pathology.  Hemostasis was achieved with electrocautery and suture ligatures of 3-0 Vicryl. The cavity was irrigated with mixture of peroxide and water.  No attempt was made to close the dead space. The cavity was packed with iodoform packing. The skin was partially closed two interrupted sutures. A dressing was applied.  The patient tolerated the procedure well and was taken to the postanesthesia care unit in stable condition.   Specimen: Right breast excisional biopsy  Complications: None  Estimated Blood Loss: 5  mL

## 2020-08-07 NOTE — Anesthesia Procedure Notes (Signed)
Procedure Name: Intubation Date/Time: 08/07/2020 12:05 PM Performed by: Aline Brochure, CRNA Pre-anesthesia Checklist: Patient identified, Patient being monitored, Timeout performed, Emergency Drugs available and Suction available Patient Re-evaluated:Patient Re-evaluated prior to induction Oxygen Delivery Method: Circle system utilized Preoxygenation: Pre-oxygenation with 100% oxygen Induction Type: IV induction Ventilation: Mask ventilation without difficulty Laryngoscope Size: Mac, 3 and McGraph Grade View: Grade I Tube type: Oral Tube size: 7.0 mm Number of attempts: 1 Airway Equipment and Method: Stylet Placement Confirmation: ETT inserted through vocal cords under direct vision,  positive ETCO2 and breath sounds checked- equal and bilateral Secured at: 21 cm Tube secured with: Tape Dental Injury: Teeth and Oropharynx as per pre-operative assessment

## 2020-08-07 NOTE — Transfer of Care (Signed)
Immediate Anesthesia Transfer of Care Note  Patient: Margaret Newman  Procedure(s) Performed: EXCISION OF BREAST BIOPSY (Right Breast)  Patient Location: PACU  Anesthesia Type:General  Level of Consciousness: awake, alert  and oriented  Airway & Oxygen Therapy: Patient Spontanous Breathing and Patient connected to face mask oxygen  Post-op Assessment: Report given to RN and Post -op Vital signs reviewed and stable  Post vital signs: Reviewed and stable  Last Vitals:  Vitals Value Taken Time  BP 120/81 08/07/20 1415  Temp 36.1 C 08/07/20 1256  Pulse 72 08/07/20 1416  Resp 16 08/07/20 1416  SpO2 92 % 08/07/20 1416  Vitals shown include unvalidated device data.  Last Pain:  Vitals:   08/07/20 1407  TempSrc:   PainSc: Asleep         Complications: No complications documented.

## 2020-08-07 NOTE — Anesthesia Preprocedure Evaluation (Signed)
Anesthesia Evaluation  Patient identified by MRN, date of birth, ID band Patient awake    Reviewed: Allergy & Precautions, H&P , NPO status , Patient's Chart, lab work & pertinent test results, reviewed documented beta blocker date and time   Airway Mallampati: III  TM Distance: >3 FB Neck ROM: full    Dental  (+) Teeth Intact   Pulmonary asthma , Current Smoker,    Pulmonary exam normal        Cardiovascular Exercise Tolerance: Good hypertension, On Medications negative cardio ROS Normal cardiovascular exam Rate:Normal     Neuro/Psych negative neurological ROS  negative psych ROS   GI/Hepatic negative GI ROS, Neg liver ROS,   Endo/Other  negative endocrine ROS  Renal/GU negative Renal ROS  negative genitourinary   Musculoskeletal   Abdominal   Peds  Hematology  (+) Blood dyscrasia, anemia ,   Anesthesia Other Findings   Reproductive/Obstetrics negative OB ROS                             Anesthesia Physical Anesthesia Plan  ASA: III  Anesthesia Plan: General LMA   Post-op Pain Management:    Induction:   PONV Risk Score and Plan: 3  Airway Management Planned:   Additional Equipment:   Intra-op Plan:   Post-operative Plan:   Informed Consent: I have reviewed the patients History and Physical, chart, labs and discussed the procedure including the risks, benefits and alternatives for the proposed anesthesia with the patient or authorized representative who has indicated his/her understanding and acceptance.       Plan Discussed with: CRNA  Anesthesia Plan Comments:         Anesthesia Quick Evaluation

## 2020-08-07 NOTE — OR Nursing (Signed)
Dr. Windell Moment in to see pt in postop @ 2:25 pm.

## 2020-08-07 NOTE — Interval H&P Note (Signed)
History and Physical Interval Note:  08/07/2020 11:34 AM  Margaret Newman  has presented today for surgery, with the diagnosis of N63.10 Rt breast mass.  The various methods of treatment have been discussed with the patient and family. After consideration of risks, benefits and other options for treatment, the patient has consented to  Procedure(s): EXCISION OF BREAST BIOPSY (Right) as a surgical intervention.  The patient's history has been reviewed, patient examined, no change in status, stable for surgery.  I have reviewed the patient's chart and labs.  Right breast marked in the pre procedure room. Questions were answered to the patient's satisfaction.     Herbert Pun

## 2020-08-10 ENCOUNTER — Encounter: Payer: Self-pay | Admitting: General Surgery

## 2020-08-10 LAB — SURGICAL PATHOLOGY

## 2020-08-11 ENCOUNTER — Other Ambulatory Visit: Payer: Self-pay

## 2020-08-11 ENCOUNTER — Emergency Department
Admission: EM | Admit: 2020-08-11 | Discharge: 2020-08-11 | Disposition: A | Discharge status: Home / Self Care | Payer: 59 | Attending: Physician Assistant | Admitting: Physician Assistant

## 2020-08-11 ENCOUNTER — Other Ambulatory Visit (HOSPITAL_COMMUNITY): Payer: Self-pay | Admitting: Physician Assistant

## 2020-08-11 DIAGNOSIS — Y999 Unspecified external cause status: Secondary | ICD-10-CM | POA: Insufficient documentation

## 2020-08-11 DIAGNOSIS — M7918 Myalgia, other site: Secondary | ICD-10-CM | POA: Insufficient documentation

## 2020-08-11 DIAGNOSIS — I10 Essential (primary) hypertension: Secondary | ICD-10-CM | POA: Diagnosis not present

## 2020-08-11 DIAGNOSIS — F17210 Nicotine dependence, cigarettes, uncomplicated: Secondary | ICD-10-CM | POA: Insufficient documentation

## 2020-08-11 DIAGNOSIS — Y92410 Unspecified street and highway as the place of occurrence of the external cause: Secondary | ICD-10-CM | POA: Diagnosis not present

## 2020-08-11 DIAGNOSIS — Z79899 Other long term (current) drug therapy: Secondary | ICD-10-CM | POA: Insufficient documentation

## 2020-08-11 DIAGNOSIS — M546 Pain in thoracic spine: Secondary | ICD-10-CM | POA: Insufficient documentation

## 2020-08-11 DIAGNOSIS — Y9389 Activity, other specified: Secondary | ICD-10-CM | POA: Diagnosis not present

## 2020-08-11 DIAGNOSIS — M25511 Pain in right shoulder: Secondary | ICD-10-CM | POA: Diagnosis not present

## 2020-08-11 DIAGNOSIS — M542 Cervicalgia: Secondary | ICD-10-CM | POA: Diagnosis present

## 2020-08-11 MED ORDER — CYCLOBENZAPRINE HCL 5 MG PO TABS: 5.0000 mg | ORAL_TABLET | Freq: Three times a day (TID) | ORAL | 0 refills | Status: DC | PRN

## 2020-08-11 MED ORDER — IBUPROFEN 800 MG PO TABS
800.0000 mg | ORAL_TABLET | Freq: Three times a day (TID) | ORAL | 0 refills | Status: DC | PRN
Start: 2020-08-11 — End: 2020-09-04
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
## 2020-08-12 NOTE — Anesthesia Postprocedure Evaluation (Signed)
Anesthesia Post Note  Patient: Margaret Newman  Procedure(s) Performed: EXCISION OF BREAST BIOPSY (Right Breast)  Patient location during evaluation: PACU Anesthesia Type: General Level of consciousness: awake and alert Pain management: pain level controlled Vital Signs Assessment: post-procedure vital signs reviewed and stable Respiratory status: spontaneous breathing, nonlabored ventilation, respiratory function stable and patient connected to nasal cannula oxygen Cardiovascular status: blood pressure returned to baseline and stable Postop Assessment: no apparent nausea or vomiting Anesthetic complications: no   No complications documented.   Last Vitals:  Vitals:   08/07/20 1437 08/07/20 1518  BP: 129/70 128/76  Pulse: 73 72  Resp: 16 16  Temp: (!) 36.4 C   SpO2: 99% 100%    Last Pain:  Vitals:   08/10/20 0912  TempSrc:   PainSc: 0-No pain                 Molli Barrows

## 2020-08-28 ENCOUNTER — Other Ambulatory Visit (HOSPITAL_COMMUNITY): Payer: Self-pay | Admitting: Obstetrics

## 2020-08-28 ENCOUNTER — Other Ambulatory Visit (HOSPITAL_COMMUNITY)
Admission: RE | Admit: 2020-08-28 | Discharge: 2020-08-28 | Disposition: A | Discharge status: Home / Self Care | Payer: 59 | Source: Ambulatory Visit | Attending: Obstetrics | Admitting: Obstetrics

## 2020-08-28 ENCOUNTER — Ambulatory Visit (INDEPENDENT_AMBULATORY_CARE_PROVIDER_SITE_OTHER): Payer: 59 | Admitting: Obstetrics

## 2020-08-28 ENCOUNTER — Other Ambulatory Visit: Payer: Self-pay

## 2020-08-28 ENCOUNTER — Encounter: Payer: Self-pay | Admitting: Obstetrics

## 2020-08-28 VITALS — BP 160/88 | HR 70 | Ht 63.0 in | Wt 191.0 lb

## 2020-08-28 DIAGNOSIS — Z113 Encounter for screening for infections with a predominantly sexual mode of transmission: Secondary | ICD-10-CM | POA: Insufficient documentation

## 2020-08-28 DIAGNOSIS — B3731 Acute candidiasis of vulva and vagina: Secondary | ICD-10-CM

## 2020-08-28 DIAGNOSIS — N898 Other specified noninflammatory disorders of vagina: Secondary | ICD-10-CM | POA: Diagnosis not present

## 2020-08-28 DIAGNOSIS — B373 Candidiasis of vulva and vagina: Secondary | ICD-10-CM | POA: Diagnosis not present

## 2020-08-28 MED ORDER — FLUCONAZOLE 150 MG PO TABS: 150.0000 mg | ORAL_TABLET | Freq: Once | ORAL | 0 refills | Status: AC

## 2020-08-28 NOTE — Progress Notes (Signed)
Obstetrics & Gynecology Office Visit   Chief Complaint:  Chief Complaint  Patient presents with  . Vaginitis    Thick crumby discharge w/o odor x2-3 days.    History of Present Illness: Margaret Newman is a new patient who presents with a c/o vaginal irritation and discharge x 4 days.  She is new to the area and would like to estabilish care with a Gyn office. In addition she requests some limited STI screening related to her primary concern of vaginal irritation . 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 She is originally from [place], having moved to th area less than a year ago. Margaret Newman recently had a cyst removed from her right breast. She was placed on antibiotics, and shares that she believes this has caused her the vaginal discharge. She has not self treated, and in fact prefers oral Diflucan to the Monistat cream.  Review of Systems:  Review of Systems  Constitutional: Negative.   Respiratory: Negative.   Cardiovascular: Negative.   Gastrointestinal: Negative.   Genitourinary: Negative.        Vaginal irritation and discharge x 4 days since finishing a course of antibiotics.  Musculoskeletal: Negative.   Skin: Negative.   Neurological: Negative.   Endo/Heme/Allergies: Negative.   Psychiatric/Behavioral: Negative.      Past Medical History:  Past Medical History:  Diagnosis Date  . Asthma    as a child- no inhalers  . Hypertension     Past Surgical History:  Past Surgical History:  Procedure Laterality Date  . ECTOPIC PREGNANCY SURGERY  2001  . EXCISION OF BREAST BIOPSY Right 08/07/2020   Procedure: EXCISION OF BREAST BIOPSY;  Surgeon: Herbert Pun, MD;  Location: [facility] ORS;  Service: General;  Laterality: Right;    Gynecologic History: Patient's last menstrual period was 08/03/2020 (exact date).  Obstetric History: No obstetric history on file.  Family History:  No family history on file.  Social History:  Social History   Socioeconomic History  .  Marital status: Single    Spouse name: Not on file  . Number of children: Not on file  . Years of education: Not on file  . Highest education level: Not on file  Occupational History  . Not on file  Tobacco Use  . Smoking status: Current Every Day Smoker    Packs/day: 0.50    Years: 22.00    Pack years: 11.00    Types: Cigarettes  . Smokeless tobacco: Never Used  Vaping Use  . Vaping Use: Never used  Substance and Sexual Activity  . Alcohol use: Not Currently  . Drug use: Yes    Types: Marijuana  . Sexual activity: Yes  Other Topics Concern  . Not on file  Social History Narrative  . Not on file   Social Determinants of Health   Financial Resource Strain: Not on file  Food Insecurity: Not on file  Transportation Needs: Not on file  Physical Activity: Not on file  Stress: Not on file  Social Connections: Not on file  Intimate Partner Violence: Not on file    Allergies:  No Known Allergies  Medications: Prior to Admission medications   Medication Sig Start Date End Date Taking? Authorizing Provider  amLODipine (NORVASC) 10 MG tablet Take 1 tablet (10 mg total) by mouth daily. 04/28/20  Yes Iloabachie, Chioma E, NP  cyclobenzaprine (FLEXERIL) 5 MG tablet Take 1 tablet (5 mg total) by mouth 3 (three) times daily as needed. 08/11/20  Yes Menshew, [employer]  Nonda Lou, PA-C  fluconazole (DIFLUCAN) 150 MG tablet Take 1 tablet (150 mg total) by mouth once for 1 dose. Can take additional dose three days later if symptoms persist 08/28/20 08/28/20 Yes Imagene Riches, CNM  amoxicillin-clavulanate (AUGMENTIN) 875-125 MG tablet Take 1 tablet by mouth 2 (two) times daily. Patient not taking: Reported on 08/28/2020 08/03/20   [provider]  chlorthalidone (HYGROTON) 25 MG tablet Take 1 tablet (25 mg total) by mouth daily. Patient not taking: Reported on 08/28/2020 04/28/20   Iloabachie, Chioma E, NP  ferrous sulfate 325 (65 FE) MG tablet Take 1 tablet (325 mg total) by mouth daily  with breakfast. Patient not taking: No sig reported 04/30/20   Iloabachie, Chioma E, NP  HYDROcodone-acetaminophen (NORCO/VICODIN) 5-325 MG tablet Take 2 tablets by mouth 2 (two) times daily as needed for pain. Patient not taking: Reported on 08/28/2020 08/03/20   [provider]  ibuprofen (ADVIL) 800 MG tablet Take 1 tablet (800 mg total) by mouth every 8 (eight) hours as needed. Patient not taking: Reported on 08/28/2020 08/11/20   Melvenia Needles, PA-C    Physical Exam Vitals:  Vitals:   08/28/20 0859  BP: (!) 160/88  Pulse: 70   Patient's last menstrual period was 08/03/2020 (exact date).  Physical Exam Constitutional:      Appearance: Normal appearance. She is obese.  HENT:     Head: Normocephalic and atraumatic.     Nose: Nose normal.  Cardiovascular:     Rate and Rhythm: Normal rate and regular rhythm.     Pulses: Normal pulses.     Heart sounds: Normal heart sounds.  Pulmonary:     Breath sounds: Wheezing present.     Comments: "smokers cough" noted. Inspiratory wheezing noted Some additional effort required for her to take a deep breath. Abdominal:     Palpations: Abdomen is soft.  Genitourinary:    General: Normal vulva.     Vagina: Vaginal discharge present.     Comments: Normal ext genitalia, no rashes or lesions. Vaginal walls are rather ruddy in appearnence. Minimal discharge noted. No malodor. Wet mount shows few clue cells, no hyphae, ample epithelial Musculoskeletal:        General: Normal range of motion.     Cervical back: Normal range of motion and neck supple.  Skin:    General: Skin is warm and dry.  Neurological:     General: No focal deficit present.     Mental Status: She is alert and oriented to person, place, and time.  Psychiatric:        Mood and Affect: Mood normal.        Behavior: Behavior normal.      Assessment: 40 y.o. No obstetric history on file. Vaginal discharge and irritation. STI screening for CZ,  GC.   Plan: Problem List Items Addressed This Visit   None   Visit Diagnoses    Vaginal discharge    -  Primary   Relevant Medications   fluconazole (DIFLUCAN) 150 MG tablet   Other Relevant Orders   Cervicovaginal ancillary only   Routine screening for STI (sexually transmitted infection)       Relevant Orders   Cervicovaginal ancillary only   Yeast vaginitis       Relevant Medications   fluconazole (DIFLUCAN) 150 MG tablet     Crvical ancillary swab sent to lab. Will Rx Diflucan for her Smoking cessation offered. She is "not ready" RTC for an annual Gyn  physical.  A toatl of 25 minutes was spent providing clinical care, reviewing her medical history , testing and documentation.  Imagene Riches, CNM  08/28/2020 9:54 AM

## 2020-08-28 NOTE — Progress Notes (Signed)
Thick crumby discharge w/o odor x2-3 days. No OTC treatment. Recently on antibiotics for a surgery.

## 2020-08-31 ENCOUNTER — Telehealth: Payer: Self-pay | Admitting: Pharmacy Technician

## 2020-08-31 LAB — CERVICOVAGINAL ANCILLARY ONLY
Bacterial Vaginitis (gardnerella): NEGATIVE
Candida Glabrata: NEGATIVE
Candida Vaginitis: POSITIVE — AB
Chlamydia: NEGATIVE
Comment: NEGATIVE
Comment: NEGATIVE
Comment: NEGATIVE
Comment: NEGATIVE
Comment: NEGATIVE
Comment: NORMAL
Neisseria Gonorrhea: NEGATIVE
Trichomonas: NEGATIVE

## 2020-08-31 NOTE — Telephone Encounter (Signed)
Patient failed to provide 2022 proof of income.  No additional medication assistance will be provided by [facility] without the required proof of income documentation.  Patient notified by letter.  [address], [place]  [id number]    August 31, 2020    [place], [place]  [id number]  Dear Margaret Newman:  This is to inform you that you are no longer eligible to receive medication assistance at [facility].  The reason(s) are:    _____Your total gross monthly household income exceeds 250% of the Federal Poverty Level.   _____Tangible assets (savings, checking, stocks/bonds, pension, retirement, etc.) exceeds our limit  _____You are eligible to receive benefits from [facility], [facility] or HIV Medication              Assistance Program _____You are eligible to receive benefits from a Medicare Part "D" plan _____You have prescription insurance  _____You are not an [facility] resident __X__Failure to provide all requested proof of income information for 2022.    Medication assistance will resume once all requested financial information has been returned to our clinic.  If you have questions, please contact our clinic at [phone].    Thank you,  [facility]

## 2020-09-04 ENCOUNTER — Ambulatory Visit (INDEPENDENT_AMBULATORY_CARE_PROVIDER_SITE_OTHER): Payer: 59 | Admitting: Obstetrics

## 2020-09-04 ENCOUNTER — Other Ambulatory Visit: Payer: Self-pay

## 2020-09-04 ENCOUNTER — Other Ambulatory Visit (HOSPITAL_COMMUNITY)
Admission: RE | Admit: 2020-09-04 | Discharge: 2020-09-04 | Disposition: A | Discharge status: Home / Self Care | Payer: 59 | Source: Ambulatory Visit | Attending: Obstetrics | Admitting: Obstetrics

## 2020-09-04 ENCOUNTER — Encounter: Payer: Self-pay | Admitting: Obstetrics

## 2020-09-04 VITALS — BP 138/90 | Ht 63.0 in | Wt 191.0 lb

## 2020-09-04 DIAGNOSIS — Z124 Encounter for screening for malignant neoplasm of cervix: Secondary | ICD-10-CM

## 2020-09-04 DIAGNOSIS — N898 Other specified noninflammatory disorders of vagina: Secondary | ICD-10-CM | POA: Diagnosis not present

## 2020-09-04 DIAGNOSIS — Z01419 Encounter for gynecological examination (general) (routine) without abnormal findings: Secondary | ICD-10-CM | POA: Insufficient documentation

## 2020-09-04 DIAGNOSIS — Z1231 Encounter for screening mammogram for malignant neoplasm of breast: Secondary | ICD-10-CM | POA: Diagnosis not present

## 2020-09-04 NOTE — Progress Notes (Signed)
Gynecology Annual Exam  PCP: Patient, No Pcp Per  Chief Complaint:  Chief Complaint  Patient presents with  . Gynecologic Exam    No concerns    History of Present Illness: Patient is a 40 y.o. G2P1011 presents for annual exam. The patient has no complaints today. She does not have a PCP presently and has a hx of HTN, continues to use tobacco regularly and had a breast cyst removed several weeks ago.  LMP: Patient's last menstrual period was 08/29/2020 (exact date). Average Interval: irregular, 28 days She shares that every few months last year she had  2 periods in a month, one of which was regular in length (5 days), and the other shorter. Duration of flow: 3-5 days Heavy Menses: no Clots: no Intermenstrual Bleeding: no Postcoital Bleeding: no Dysmenorrhea: no   The patient is sexually active. She currently uses none for contraception. She denies dyspareunia.  The patient does perform self breast exams.  There is no notable family history of breast or ovarian cancer in her family.  The patient wears seatbelts: yes.   The patient has regular exercise: no.    The patient denies current symptoms of depression.    Review of Systems: ROS  Past Medical History:  Patient Active Problem List   Diagnosis Date Noted  . Iron deficiency anemia 05/11/2020  . Prediabetes 02/06/2020  . Abnormal laboratory test 02/06/2020  . Encounter to establish care 12/26/2019  . History of carpal tunnel syndrome 12/26/2019  . Ear drainage right 12/26/2019  . Snoring 12/26/2019  . Right ear impacted cerumen 12/26/2019  . Hypertension 05/11/2019    Past Surgical History:  Past Surgical History:  Procedure Laterality Date  . ECTOPIC PREGNANCY SURGERY  2001  . EXCISION OF BREAST BIOPSY Right 08/07/2020   Procedure: EXCISION OF BREAST BIOPSY;  Surgeon: Herbert Pun, MD;  Location: [facility] ORS;  Service: General;  Laterality: Right;    Gynecologic History:  Patient's last menstrual  period was 08/29/2020 (exact date). Contraception: none Last Pap: Results were: unknown date of last pap she reports normal pap.  Last mammogram: no mammo orders previously noted  Results were: NA.  Mammogram ordered today as baseline  Obstetric History: G2P1011  Family History:  History reviewed. No pertinent family history.  Social History:  Social History   Socioeconomic History  . Marital status: Single    Spouse name: Not on file  . Number of children: Not on file  . Years of education: Not on file  . Highest education level: Not on file  Occupational History  . Not on file  Tobacco Use  . Smoking status: Current Every Day Smoker    Packs/day: 0.50    Years: 22.00    Pack years: 11.00    Types: Cigarettes  . Smokeless tobacco: Never Used  Vaping Use  . Vaping Use: Never used  Substance and Sexual Activity  . Alcohol use: Not Currently  . Drug use: Yes    Types: Marijuana  . Sexual activity: Yes    Birth control/protection: None  Other Topics Concern  . Not on file  Social History Narrative  . Not on file   Social Determinants of Health   Financial Resource Strain: Not on file  Food Insecurity: Not on file  Transportation Needs: Not on file  Physical Activity: Not on file  Stress: Not on file  Social Connections: Not on file  Intimate Partner Violence: Not on file    Allergies:  No Known  Allergies  Medications: Prior to Admission medications   Medication Sig Start Date End Date Taking? Authorizing Provider  amLODipine (NORVASC) 10 MG tablet Take 1 tablet (10 mg total) by mouth daily. 04/28/20  Yes Iloabachie, Chioma E, NP  chlorthalidone (HYGROTON) 25 MG tablet Take 1 tablet (25 mg total) by mouth daily. 04/28/20  Yes Iloabachie, Chioma E, NP    Physical Exam Vitals: Blood pressure 138/90, height 5\' 3"  (1.6 m), weight 191 lb (86.6 kg), last menstrual period 08/29/2020.  General: NAD HEENT: normocephalic, anicteric Thyroid: no enlargement, no  palpable nodules Pulmonary: No increased work of breathing, CTAB Cardiovascular: RRR, distal pulses 2+ Breast: Breast symmetrical, no tenderness, no palpable nodules or masses, no skin or nipple retraction present, no nipple discharge.  No axillary or supraclavicular lymphadenopathy. Abdomen: NABS, soft, non-tender, non-distended.  Umbilicus without lesions.  No hepatomegaly, splenomegaly or masses palpable. No evidence of hernia  Genitourinary:  External: Normal external female genitalia.  Normal urethral meatus, normal Bartholin's and Skene's glands.    Vagina: Normal vaginal mucosa, no evidence of prolapse.    Cervix: Grossly normal in appearance, no bleeding  Uterus: Non-enlarged, mobile, normal contour.  No CMT  Adnexa: ovaries non-enlarged, no adnexal masses  Rectal: deferred  Lymphatic: no evidence of inguinal lymphadenopathy Extremities: no edema, erythema, or tenderness Neurologic: Grossly intact Psychiatric: mood appropriate, affect full  Female chaperone present for pelvic and breast  portions of the physical exam    Assessment: 40 y.o. G2P1011 routine annual exam  Plan: Problem List Items Addressed This Visit   None   Visit Diagnoses    Women's annual routine gynecological examination    -  Primary   Relevant Orders   Cytology - PAP   NuSwab BV and Candida, NAA   Cervical cancer screening       Relevant Orders   Cytology - PAP   Vaginal discharge       Relevant Orders   NuSwab BV and Candida, NAA      1) Mammogram - recommend yearly screening mammogram.  Mammogram Was ordered today   2) STI screening  wasoffered and accepted  3) ASCCP guidelines and rational discussed.  Patient opts for every 3 years screening interval  4) Contraception - the patient is currently using  none.  She is declining any formal contraception today. Advised that she could theoretically conceive despite being older and is offered [facility] options today,  5) Colonoscopy -- Screening  recommended starting at age 73 for average risk individuals, age 61 for individuals deemed at increased risk (including African Americans) and recommended to continue until age 8.  For patient age 37-85 individualized approach is recommended.  Gold standard screening is via colonoscopy, Cologuard screening is an acceptable alternative for patient unwilling or unable to undergo colonoscopy.  "Colorectal cancer screening for average?risk adults: 2018 guideline update from the [place]: A Cancer Journal for Clinicians: Nov 09, 2016   6) Routine healthcare maintenance including cholesterol, diabetes screening discussed managed by PCP  7) Return in about 1 year (around 09/04/2021) for annual.   Pap smear retrieved. STI screening for [facility] with her consent. Tobacco cessation discussed- again she declines formal assistance with this. Strongly encouraged her to RTC if she would like to discuss medication for cessation.   Imagene Riches, CNM  09/04/2020 2:13 PM   [facility] 09/04/2020, 1:54 PM

## 2020-09-08 ENCOUNTER — Encounter: Payer: Self-pay | Admitting: Obstetrics

## 2020-09-08 LAB — NUSWAB BV AND CANDIDA, NAA
Candida albicans, NAA: NEGATIVE
Candida glabrata, NAA: NEGATIVE

## 2020-09-09 ENCOUNTER — Encounter: Payer: Self-pay | Admitting: Obstetrics

## 2020-09-09 LAB — CYTOLOGY - PAP
Chlamydia: NEGATIVE
Comment: NEGATIVE
Comment: NEGATIVE
Comment: NORMAL
Diagnosis: UNDETERMINED — AB
High risk HPV: NEGATIVE
Neisseria Gonorrhea: NEGATIVE

## 2020-10-06 ENCOUNTER — Other Ambulatory Visit: Payer: Self-pay

## 2020-10-06 DIAGNOSIS — H6061 Unspecified chronic otitis externa, right ear: Secondary | ICD-10-CM | POA: Diagnosis not present

## 2020-10-06 DIAGNOSIS — H6121 Impacted cerumen, right ear: Secondary | ICD-10-CM | POA: Diagnosis not present

## 2020-10-06 MED ORDER — CIPROFLOXACIN-DEXAMETHASONE 0.3-0.1 % OT SUSP
OTIC | 3 refills | Status: DC
  Filled 2020-10-06: qty 7.5, 14d supply, fill #0

## 2020-10-09 ENCOUNTER — Other Ambulatory Visit: Payer: Self-pay | Admitting: Obstetrics

## 2020-10-09 ENCOUNTER — Other Ambulatory Visit: Payer: Self-pay

## 2020-10-09 DIAGNOSIS — N898 Other specified noninflammatory disorders of vagina: Secondary | ICD-10-CM

## 2020-10-09 MED ORDER — FLUCONAZOLE 150 MG PO TABS
ORAL_TABLET | ORAL | 0 refills | Status: DC
  Filled 2020-10-09: qty 2, 2d supply, fill #0

## 2020-10-13 ENCOUNTER — Other Ambulatory Visit: Payer: Self-pay | Admitting: Otolaryngology

## 2020-10-13 DIAGNOSIS — H9011 Conductive hearing loss, unilateral, right ear, with unrestricted hearing on the contralateral side: Secondary | ICD-10-CM | POA: Diagnosis not present

## 2020-10-13 DIAGNOSIS — H90A11 Conductive hearing loss, unilateral, right ear with restricted hearing on the contralateral side: Secondary | ICD-10-CM

## 2020-10-13 DIAGNOSIS — H7201 Central perforation of tympanic membrane, right ear: Secondary | ICD-10-CM | POA: Diagnosis not present

## 2020-10-19 ENCOUNTER — Emergency Department
Admission: EM | Admit: 2020-10-19 | Discharge: 2020-10-19 | Disposition: A | Discharge status: Home / Self Care | Payer: 59 | Attending: Emergency Medicine | Admitting: Emergency Medicine

## 2020-10-19 ENCOUNTER — Other Ambulatory Visit: Payer: Self-pay

## 2020-10-19 DIAGNOSIS — G5603 Carpal tunnel syndrome, bilateral upper limbs: Secondary | ICD-10-CM | POA: Diagnosis not present

## 2020-10-19 DIAGNOSIS — F17210 Nicotine dependence, cigarettes, uncomplicated: Secondary | ICD-10-CM | POA: Insufficient documentation

## 2020-10-19 DIAGNOSIS — M79641 Pain in right hand: Secondary | ICD-10-CM | POA: Insufficient documentation

## 2020-10-19 DIAGNOSIS — M25532 Pain in left wrist: Secondary | ICD-10-CM | POA: Insufficient documentation

## 2020-10-19 DIAGNOSIS — M79642 Pain in left hand: Secondary | ICD-10-CM | POA: Diagnosis not present

## 2020-10-19 DIAGNOSIS — G5601 Carpal tunnel syndrome, right upper limb: Secondary | ICD-10-CM | POA: Diagnosis not present

## 2020-10-19 DIAGNOSIS — M25531 Pain in right wrist: Secondary | ICD-10-CM | POA: Insufficient documentation

## 2020-10-19 DIAGNOSIS — I10 Essential (primary) hypertension: Secondary | ICD-10-CM | POA: Insufficient documentation

## 2020-10-19 DIAGNOSIS — G4733 Obstructive sleep apnea (adult) (pediatric): Secondary | ICD-10-CM | POA: Diagnosis not present

## 2020-10-19 DIAGNOSIS — J45909 Unspecified asthma, uncomplicated: Secondary | ICD-10-CM | POA: Diagnosis not present

## 2020-10-19 DIAGNOSIS — G5602 Carpal tunnel syndrome, left upper limb: Secondary | ICD-10-CM | POA: Diagnosis not present

## 2020-10-19 MED ORDER — MELOXICAM 15 MG PO TABS
15.0000 mg | ORAL_TABLET | Freq: Every day | ORAL | 0 refills | Status: DC
  Filled 2020-10-19: qty 30, 30d supply, fill #0

## 2020-10-19 NOTE — ED Provider Notes (Signed)
[facility] Emergency Department Provider Note  ____________________________________________  Time seen: Approximately 3:22 PM  I have reviewed the triage vital signs and the nursing notes.   HISTORY  Chief Complaint Arm Pain    HPI Margaret Newman is a 40 y.o. female who presents emergency department complaining of bilateral wrist and hand pain.  Patient is a burning/numb/tingling sensation running from her forearms into her hands.  This is been ongoing for greater than a year.  She does repetitive motions with her hands.  No definitive injury.  Patient has tried over-the-counter medications without relief.         Past Medical History:  Diagnosis Date  . Asthma    as a child- no inhalers  . Hypertension     Patient Active Problem List   Diagnosis Date Noted  . Iron deficiency anemia 05/11/2020  . Prediabetes 02/06/2020  . Abnormal laboratory test 02/06/2020  . Encounter to establish care 12/26/2019  . History of carpal tunnel syndrome 12/26/2019  . Ear drainage right 12/26/2019  . Snoring 12/26/2019  . Right ear impacted cerumen 12/26/2019  . Hypertension 05/11/2019    Past Surgical History:  Procedure Laterality Date  . ECTOPIC PREGNANCY SURGERY  2001  . EXCISION OF BREAST BIOPSY Right 08/07/2020   Procedure: EXCISION OF BREAST BIOPSY;  Surgeon: Herbert Pun, MD;  Location: [facility] ORS;  Service: General;  Laterality: Right;    Prior to Admission medications   Medication Sig Start Date End Date Taking? Authorizing Provider  meloxicam (MOBIC) 15 MG tablet Take 1 tablet (15 mg total) by mouth daily. 10/19/20  Yes Prescilla Monger, Charline Bills, PA-C  amLODipine (NORVASC) 10 MG tablet TAKE ONE TABLET BY MOUTH EVERY DAY 04/28/20 04/28/21  Iloabachie, Chioma E, NP  amoxicillin-clavulanate (AUGMENTIN) 875-125 MG tablet TAKE 1 TABLET BY MOUTH 2 (TWO) TIMES DAILY FOR 10 DAYS. 08/07/20 08/07/21  Herbert Pun, MD  amoxicillin-clavulanate  (AUGMENTIN) 875-125 MG tablet TAKE 1 TABLET BY MOUTH TWICE DAILY WITH FOOD FOR 10 DAYS 08/03/20 08/03/21  Cyndie Chime, MD  chlorthalidone (HYGROTON) 25 MG tablet TAKE ONE TABLET BY MOUTH EVERY DAY 04/28/20 11/10/20  Iloabachie, Chioma E, NP  ciprofloxacin-dexamethasone (CIPRODEX) OTIC suspension Location: Right Ear. 4 drops each right ear twice a day for 7 days 10/06/20     cyclobenzaprine (FLEXERIL) 5 MG tablet TAKE 1 TABLET (5 MG TOTAL) BY MOUTH 3 (THREE) TIMES DAILY AS NEEDED. 08/11/20 08/11/21  Menshew, Dannielle Karvonen, PA-C  fluconazole (DIFLUCAN) 150 MG tablet TAKE 1 TABLET BY MOUTH ONCE FOR 1 DOSE. CAN TAKE ADDITIONAL DOSE THREE DAYS LATER IF SYMPTOMS PERSIST. 10/09/20 10/09/21  Imagene Riches, CNM  HYDROcodone-acetaminophen (NORCO/VICODIN) 5-325 MG tablet TAKE 1 TABLET BY MOUTH EVERY 4 (FOUR) HOURS AS NEEDED FOR MODERATE PAIN FOR UP TO 3 DAYS 08/07/20 02/03/21  Herbert Pun, MD  HYDROcodone-acetaminophen (NORCO/VICODIN) 5-325 MG tablet TAKE 1 TABLET BY MOUTH EVERY 6 (SIX) HOURS AS NEEDED FOR PAIN FOR UP TO 20 DOSES. RESERVE FOR BEDTIME. 08/03/20 01/30/21  Cyndie Chime, MD  ibuprofen (ADVIL) 800 MG tablet TAKE 1 TABLET BY MOUTH EVERY 8 HOURS AS NEEDED. 08/11/20 08/11/21  Menshew, Dannielle Karvonen, PA-C  ferrous sulfate 325 (65 FE) MG tablet Take 1 tablet (325 mg total) by mouth daily with breakfast. Patient not taking: No sig reported 04/30/20 09/04/20  Iloabachie, Chioma E, NP    Allergies Patient has no known allergies.  No family history on file.  Social History Social History   Tobacco Use  .  Smoking status: Current Every Day Smoker    Packs/day: 0.50    Years: 22.00    Pack years: 11.00    Types: Cigarettes  . Smokeless tobacco: Never Used  Vaping Use  . Vaping Use: Never used  Substance Use Topics  . Alcohol use: Not Currently  . Drug use: Yes    Types: Marijuana     Review of Systems  Constitutional: No fever/chills Eyes: No visual changes. No discharge ENT: No  upper respiratory complaints. Cardiovascular: no chest pain. Respiratory: no cough. No SOB. Gastrointestinal: No abdominal pain.  No nausea, no vomiting.  No diarrhea.  No constipation. Musculoskeletal: Bilateral hand/wrist pain Skin: Negative for rash, abrasions, lacerations, ecchymosis. Neurological: Negative for headaches, focal weakness or numbness.  10 System ROS otherwise negative.  ____________________________________________   PHYSICAL EXAM:  VITAL SIGNS: ED Triage Vitals [10/19/20 1222]  Enc Vitals Group     BP (!) 159/101     Pulse Rate 78     Resp 18     Temp 99 F (37.2 C)     Temp Source Oral     SpO2 100 %     Weight 195 lb (88.5 kg)     Height 5\' 3"  (1.6 m)     Head Circumference      Peak Flow      Pain Score 8     Pain Loc      Pain Edu?      Excl. in [place]?      Constitutional: Alert and oriented. Well appearing and in no acute distress. Eyes: Conjunctivae are normal. PERRL. EOMI. Head: Atraumatic. ENT:      Ears:       Nose: No congestion/rhinnorhea.      Mouth/Throat: Mucous membranes are moist.  Neck: No stridor.    Cardiovascular: Normal rate, regular rhythm. Normal S1 and S2.  Good peripheral circulation. Respiratory: Normal respiratory effort without tachypnea or retractions. Lungs CTAB. Good air entry to the bases with no decreased or absent breath sounds. Musculoskeletal: Full range of motion to all extremities. No gross deformities appreciated.  Visualization of bilateral wrist and hands reveals no obvious signs of trauma.  No edema, erythema, ecchymosis bilaterally.  Full range of motion bilaterally.  Nontender over the forearms or hands bilaterally.  Dorsalis pedis pulse and sensation intact and equal bilateral hands.  Positive for Phalen's, negative Tinel's. Neurologic:  Normal speech and language. No gross focal neurologic deficits are appreciated.  Skin:  Skin is warm, dry and intact. No rash noted. Psychiatric: Mood and affect are normal.  Speech and behavior are normal. Patient exhibits appropriate insight and judgement.   ____________________________________________   LABS (all labs ordered are listed, but only abnormal results are displayed)  Labs Reviewed - No data to display ____________________________________________  EKG   ____________________________________________  RADIOLOGY   No results found.  ____________________________________________    PROCEDURES  Procedure(s) performed:    Procedures    Medications - No data to display   ____________________________________________   INITIAL IMPRESSION / ASSESSMENT AND PLAN / ED COURSE  Pertinent labs & imaging results that were available during my care of the patient were reviewed by me and considered in my medical decision making (see chart for details).  Review of the [place] CSRS was performed in accordance of the [place] prior to dispensing any controlled drugs.           Patient's diagnosis is consistent with bilateral carpal tunnel syndrome.  Patient presents the emergency  department with numbness and tingling and pain to the bilateral wrists and hands.  Nontraumatic in nature.  She does repetitive range of motion at work with her hands.  This been ongoing for greater than a year. We will provide Velcro wrist splints and anti-inflammatory and follow-up with hand surgery..  Return precautions discussed with the patient.  Meloxicam will be prescribed for the patient.  Patient is given ED precautions to return to the ED for any worsening or new symptoms.     ____________________________________________  FINAL CLINICAL IMPRESSION(S) / ED DIAGNOSES  Final diagnoses:  Bilateral carpal tunnel syndrome      NEW MEDICATIONS STARTED DURING THIS VISIT:  ED Discharge Orders         Ordered    meloxicam (MOBIC) 15 MG tablet  Daily        10/19/20 1524              This chart was dictated using voice recognition software/Dragon.  Despite best efforts to proofread, errors can occur which can change the meaning. Any change was purely unintentional.   Darletta Moll, PA-C 10/19/20 1527    Lavonia Drafts, MD 10/20/20 1324

## 2020-10-19 NOTE — ED Triage Notes (Signed)
Pt to ED for bilateral wrist/hand pain x2 weeks. No swelling or bruising noted.  Pain worsens with movement.  Denies injury  Hx HTN

## 2020-10-27 ENCOUNTER — Ambulatory Visit: Payer: 59

## 2020-10-30 ENCOUNTER — Other Ambulatory Visit: Payer: Self-pay

## 2020-10-30 ENCOUNTER — Ambulatory Visit: Payer: 59 | Attending: Otolaryngology

## 2020-11-03 DIAGNOSIS — G5603 Carpal tunnel syndrome, bilateral upper limbs: Secondary | ICD-10-CM | POA: Diagnosis not present

## 2020-12-16 ENCOUNTER — Other Ambulatory Visit: Payer: Self-pay

## 2020-12-16 ENCOUNTER — Emergency Department
Admission: EM | Admit: 2020-12-16 | Discharge: 2020-12-16 | Disposition: A | Discharge status: Home / Self Care | Payer: 59 | Attending: Emergency Medicine | Admitting: Emergency Medicine

## 2020-12-16 DIAGNOSIS — X503XXA Overexertion from repetitive movements, initial encounter: Secondary | ICD-10-CM | POA: Insufficient documentation

## 2020-12-16 DIAGNOSIS — M25511 Pain in right shoulder: Secondary | ICD-10-CM | POA: Insufficient documentation

## 2020-12-16 DIAGNOSIS — Z5321 Procedure and treatment not carried out due to patient leaving prior to being seen by health care provider: Secondary | ICD-10-CM | POA: Insufficient documentation

## 2020-12-16 DIAGNOSIS — Y990 Civilian activity done for income or pay: Secondary | ICD-10-CM | POA: Diagnosis not present

## 2020-12-16 NOTE — ED Triage Notes (Addendum)
Pt arrives POV with C/O R shoulder pain that began yesterday at work when lifting around 15 lbs repeatedly. Pt needs WC profile and WC protocols.

## 2020-12-16 NOTE — ED Notes (Signed)
Pt states that she has been having pain and tightness in the right shoulder since last pm, states that she has carpal tunnel and states that she was trying to baby her right wrist and used her forearm and shoulder more. Pt has discomfort when she raises her right arm, + radial pulse

## 2020-12-16 NOTE — ED Notes (Signed)
Pt left, witnessed by tech. Pt said to the tech that she was "looking for the way out."

## 2020-12-17 DIAGNOSIS — G5603 Carpal tunnel syndrome, bilateral upper limbs: Secondary | ICD-10-CM | POA: Diagnosis not present

## 2020-12-17 DIAGNOSIS — M25511 Pain in right shoulder: Secondary | ICD-10-CM | POA: Diagnosis not present

## 2020-12-17 HISTORY — DX: Carpal tunnel syndrome, bilateral upper limbs: G56.03

## 2020-12-31 ENCOUNTER — Other Ambulatory Visit: Payer: Self-pay

## 2021-01-07 DIAGNOSIS — G5603 Carpal tunnel syndrome, bilateral upper limbs: Secondary | ICD-10-CM | POA: Diagnosis not present

## 2021-04-02 ENCOUNTER — Other Ambulatory Visit: Payer: Self-pay

## 2021-04-02 DIAGNOSIS — G5603 Carpal tunnel syndrome, bilateral upper limbs: Secondary | ICD-10-CM | POA: Diagnosis not present

## 2021-04-02 MED ORDER — MELOXICAM 15 MG PO TABS
ORAL_TABLET | ORAL | 0 refills | Status: DC
  Filled 2021-04-02 – 2022-01-04 (×2): qty 30, 30d supply, fill #0

## 2021-04-13 ENCOUNTER — Other Ambulatory Visit: Payer: Self-pay

## 2021-04-22 ENCOUNTER — Other Ambulatory Visit: Payer: Self-pay

## 2021-05-09 ENCOUNTER — Other Ambulatory Visit: Payer: Self-pay | Admitting: Gerontology

## 2021-05-09 DIAGNOSIS — I10 Essential (primary) hypertension: Secondary | ICD-10-CM

## 2021-05-11 ENCOUNTER — Other Ambulatory Visit: Payer: Self-pay | Admitting: Gerontology

## 2021-05-11 ENCOUNTER — Other Ambulatory Visit: Payer: Self-pay

## 2021-05-11 DIAGNOSIS — I10 Essential (primary) hypertension: Secondary | ICD-10-CM

## 2021-05-11 MED ORDER — AMLODIPINE BESYLATE 10 MG PO TABS
ORAL_TABLET | Freq: Every day | ORAL | 0 refills | Status: DC
  Filled 2021-05-11: qty 30, 30d supply, fill #0

## 2021-05-11 MED ORDER — CHLORTHALIDONE 25 MG PO TABS
ORAL_TABLET | Freq: Every day | ORAL | 0 refills | Status: DC
  Filled 2021-05-11: qty 30, 30d supply, fill #0

## 2021-06-04 ENCOUNTER — Other Ambulatory Visit: Payer: Self-pay

## 2021-06-04 DIAGNOSIS — N611 Abscess of the breast and nipple: Secondary | ICD-10-CM | POA: Diagnosis not present

## 2021-06-04 MED ORDER — HYDROCODONE-ACETAMINOPHEN 5-325 MG PO TABS
ORAL_TABLET | ORAL | 0 refills | Status: DC
  Filled 2021-06-04: qty 20, 5d supply, fill #0

## 2021-06-04 MED ORDER — SULFAMETHOXAZOLE-TRIMETHOPRIM 800-160 MG PO TABS
ORAL_TABLET | ORAL | 0 refills | Status: DC
  Filled 2021-06-04: qty 20, 10d supply, fill #0

## 2021-06-04 MED ORDER — NAPROXEN 500 MG PO TABS
ORAL_TABLET | ORAL | 1 refills | Status: DC
  Filled 2021-06-04: qty 20, 10d supply, fill #0
  Filled 2022-02-11: qty 20, 10d supply, fill #1

## 2021-06-09 DIAGNOSIS — N611 Abscess of the breast and nipple: Secondary | ICD-10-CM | POA: Diagnosis not present

## 2021-06-09 DIAGNOSIS — F17200 Nicotine dependence, unspecified, uncomplicated: Secondary | ICD-10-CM | POA: Diagnosis not present

## 2021-07-01 DIAGNOSIS — L732 Hidradenitis suppurativa: Secondary | ICD-10-CM | POA: Diagnosis not present

## 2021-07-01 DIAGNOSIS — Z5181 Encounter for therapeutic drug level monitoring: Secondary | ICD-10-CM | POA: Diagnosis not present

## 2021-07-02 ENCOUNTER — Other Ambulatory Visit: Payer: Self-pay

## 2021-07-02 MED ORDER — SILVER SULFADIAZINE 1 % EX CREA
TOPICAL_CREAM | CUTANEOUS | 1 refills | Status: DC
  Filled 2021-07-02: qty 400, 20d supply, fill #0

## 2021-07-02 MED ORDER — DOXYCYCLINE HYCLATE 100 MG PO CAPS
ORAL_CAPSULE | ORAL | 1 refills | Status: DC
  Filled 2021-07-02: qty 45, 45d supply, fill #0
  Filled 2021-08-20 – 2021-10-04 (×2): qty 45, 45d supply, fill #1

## 2021-08-20 ENCOUNTER — Ambulatory Visit (INDEPENDENT_AMBULATORY_CARE_PROVIDER_SITE_OTHER): Payer: 59 | Admitting: Obstetrics and Gynecology

## 2021-08-20 ENCOUNTER — Encounter: Payer: Self-pay | Admitting: Obstetrics and Gynecology

## 2021-08-20 ENCOUNTER — Other Ambulatory Visit: Payer: Self-pay

## 2021-08-20 VITALS — BP 116/70 | Ht 62.0 in | Wt 195.0 lb

## 2021-08-20 DIAGNOSIS — N771 Vaginitis, vulvitis and vulvovaginitis in diseases classified elsewhere: Secondary | ICD-10-CM

## 2021-08-20 DIAGNOSIS — B379 Candidiasis, unspecified: Secondary | ICD-10-CM | POA: Diagnosis not present

## 2021-08-20 DIAGNOSIS — R875 Abnormal microbiological findings in specimens from female genital organs: Secondary | ICD-10-CM

## 2021-08-20 MED ORDER — FLUCONAZOLE 150 MG PO TABS
150.0000 mg | ORAL_TABLET | ORAL | 0 refills | Status: AC
  Filled 2021-08-20: qty 2, 6d supply, fill #0

## 2021-08-20 NOTE — Patient Instructions (Signed)
Vaginal Yeast Infection, Adult ?Vaginal yeast infection is a condition that causes vaginal discharge as well as soreness, swelling, and redness (inflammation) of the vagina. This is a common condition. Some women get this infection frequently. ?What are the causes? ?This condition is caused by a change in the normal balance of the yeast (Candida) and normal bacteria that live in the vagina. This change causes an overgrowth of yeast, which causes the inflammation. ?What increases the risk? ?The condition is more likely to develop in women who: ?Take antibiotic medicines. ?Have diabetes. ?Take birth control pills. ?Are pregnant. ?Douche often. ?Have a weak body defense system (immune system). ?Have been taking steroid medicines for a long time. ?Frequently wear tight clothing. ?What are the signs or symptoms? ?Symptoms of this condition include: ?White, thick, creamy vaginal discharge. ?Swelling, itching, redness, and irritation of the vagina. The lips of the vagina (labia) may be affected as well. ?Pain or a burning feeling while urinating. ?Pain during sex. ?How is this diagnosed? ?This condition is diagnosed based on: ?Your medical history. ?A physical exam. ?A pelvic exam. Your health care provider will examine a sample of your vaginal discharge under a microscope. Your health care provider may send this sample for testing to confirm the diagnosis. ?How is this treated? ?This condition is treated with medicine. Medicines may be over-the-counter or prescription. You may be told to use one or more of the following: ?Medicine that is taken by mouth (orally). ?Medicine that is applied as a cream (topically). ?Medicine that is inserted directly into the vagina (suppository). ?Follow these instructions at home: ?Take or apply over-the-counter and prescription medicines only as told by your health care provider. ?Do not use tampons until your health care provider approves. ?Do not have sex until your infection has  cleared. Sex can prolong or worsen your symptoms of infection. Ask your health care provider when it is safe to resume sexual activity. ?Keep all follow-up visits. This is important. ?How is this prevented? ? ?Do not wear tight clothes, such as pantyhose or tight pants. ?Wear breathable cotton underwear. ?Do not use douches, perfumed soap, creams, or powders. ?Wipe from front to back after using the toilet. ?If you have diabetes, keep your blood sugar levels under control. ?Ask your health care provider for other ways to prevent yeast infections. ?Contact a health care provider if: ?You have a fever. ?Your symptoms go away and then return. ?Your symptoms do not get better with treatment. ?Your symptoms get worse. ?You have new symptoms. ?You develop blisters in or around your vagina. ?You have blood coming from your vagina and it is not your menstrual period. ?You develop pain in your abdomen. ?Summary ?Vaginal yeast infection is a condition that causes discharge as well as soreness, swelling, and redness (inflammation) of the vagina. ?This condition is treated with medicine. Medicines may be over-the-counter or prescription. ?Take or apply over-the-counter and prescription medicines only as told by your health care provider. ?Do not douche. Resume sexual activity or use of tampons as instructed by your health care provider. ?Contact a health care provider if your symptoms do not get better with treatment or your symptoms go away and then return. ?This information is not intended to replace advice given to you by your health care provider. Make sure you discuss any questions you have with your health care provider. ?Document Revised: 08/17/2020 Document Reviewed: 08/17/2020 ?Elsevier Patient Education ? [place]. ? ?

## 2021-08-20 NOTE — Progress Notes (Signed)
? ?Patient ID: Margaret Newman, female   DOB: [birth year], 41 y.o.   MRN: [id number] ? ?Reason for Consult: Vaginal Discharge (Itchiness and irritation, no odor x 2 days) ?  ?Referred by No ref. provider found ? ?Subjective:  ?   ?HPI: ? ?Margaret Newman is a 40 y.o. female she reports that for the last 2 days she has had a itching and irritation after intercourse.  She has not noticed a foul odor.  She feels like she may have a yeast infection.  He denies any pain with urination. ? ?Gynecological History ? ?Patient's last menstrual period was 07/30/2021 (approximate). ? ?Past Medical History:  ?Diagnosis Date  ? Asthma   ? as a child- no inhalers  ? Hypertension   ? ?History reviewed. No pertinent family history. ?Past Surgical History:  ?Procedure Laterality Date  ? ECTOPIC PREGNANCY SURGERY  2001  ? EXCISION OF BREAST BIOPSY Right 08/07/2020  ? Procedure: EXCISION OF BREAST BIOPSY;  Surgeon: Herbert Pun, MD;  Location: [facility] ORS;  Service: General;  Laterality: Right;  ? ? ?Short Social History:  ?Social History  ? ?Tobacco Use  ? Smoking status: Every Day  ?  Packs/day: 1.00  ?  Years: 22.00  ?  Pack years: 22.00  ?  Types: Cigarettes  ? Smokeless tobacco: Never  ?Substance Use Topics  ? Alcohol use: Yes  ?  Comment: Occasionally  ? ? ?No Known Allergies ? ?Current Outpatient Medications  ?Medication Sig Dispense Refill  ? amLODipine (NORVASC) 10 MG tablet TAKE ONE TABLET BY MOUTH EVERY DAY 30 tablet 0  ? chlorthalidone (HYGROTON) 25 MG tablet TAKE ONE TABLET BY MOUTH EVERY DAY 30 tablet 0  ? ciprofloxacin-dexamethasone (CIPRODEX) OTIC suspension Location: Right Ear. 4 drops each right ear twice a day for 7 days 7.5 mL 3  ? doxycycline (VIBRAMYCIN) 100 MG capsule Take one capsule by mouth daily after food with a full glass of water 45 capsule 1  ? HYDROcodone-acetaminophen (NORCO/VICODIN) 5-325 MG tablet Take one tablet at night for pain; may take up to every 6 hours as needed for pain if not working  or driving 20 tablet 0  ? meloxicam (MOBIC) 15 MG tablet Take 1 tablet every day by oral route. 30 tablet 0  ? naproxen (NAPROSYN) 500 MG tablet Take 1 tablet (500 mg total) by mouth 2 (two) times daily as needed for up to 10 days Take with food. 20 tablet 1  ? silver sulfADIAZINE (SILVADENE) 1 % cream Apply topically. 400 g 1  ? ?No current facility-administered medications for this visit.  ? ? ?Review of Systems  ?Constitutional: Negative for chills, fatigue, fever and unexpected weight change.  ?HENT: Negative for trouble swallowing.  ?Eyes: Negative for loss of vision.  ?Respiratory: Negative for cough, shortness of breath and wheezing.  ?Cardiovascular: Negative for chest pain, leg swelling, palpitations and syncope.  ?GI: Negative for abdominal pain, blood in stool, diarrhea, nausea and vomiting.  ?GU: Negative for difficulty urinating, dysuria, frequency and hematuria.  ?Musculoskeletal: Negative for back pain, leg pain and joint pain.  ?Skin: Negative for rash.  ?Neurological: Negative for dizziness, headaches, light-headedness, numbness and seizures.  ?Psychiatric: Negative for behavioral problem, confusion, depressed mood and sleep disturbance.   ? ?   ?Objective:  ?Objective  ? ?Vitals:  ? 08/20/21 1100  ?BP: 116/70  ?Weight: 195 lb (88.5 kg)  ?Height: '5\' 2"'$  (1.575 m)  ? ?Body mass index is 35.67 kg/m?. ? ?Physical Exam ?Vitals and nursing note reviewed.  Exam conducted with a chaperone present.  ?Constitutional:   ?   Appearance: Normal appearance. She is well-developed.  ?HENT:  ?   Head: Normocephalic and atraumatic.  ?Eyes:  ?   Extraocular Movements: Extraocular movements intact.  ?   Pupils: Pupils are equal, round, and reactive to light.  ?Cardiovascular:  ?   Rate and Rhythm: Normal rate and regular rhythm.  ?Pulmonary:  ?   Effort: Pulmonary effort is normal. No respiratory distress.  ?   Breath sounds: Normal breath sounds.  ?Abdominal:  ?   General: Abdomen is flat.  ?   Palpations: Abdomen is  soft.  ?Genitourinary: ?   Comments: External: Normal appearing vulva. No lesions noted.  ?Speculum examination: Normal appearing cervix. No blood in the vaginal vault. Thick white discharge.   IUD strings seen ?Bimanual examination: Uterus midline, non-tender, normal in size, shape and contour.  No CMT. No adnexal masses. No adnexal tenderness. Pelvis not fixed. ? ?Breast exam: exam not performed ?Musculoskeletal:     ?   General: No signs of injury.  ?Skin: ?   General: Skin is warm and dry.  ?Neurological:  ?   Mental Status: She is alert and oriented to person, place, and time.  ?Psychiatric:     ?   Behavior: Behavior normal.     ?   Thought Content: Thought content normal.     ?   Judgment: Judgment normal.  ? ? ?Assessment/Plan:  ?  ? ?41 year old with suspected yeast infection ?Prescription for Diflucan sent. ?We will check for infection with NuSwab. ? ?More than 10 minutes were spent face to face with the patient in the room, reviewing the medical record, labs and images, and coordinating care for the patient. The plan of management was discussed in detail and counseling was provided.  ?  ?Adrian Prows MD ?[place], [place] ?08/20/2021 ?11:05 AM ? ? ?

## 2021-08-24 LAB — NUSWAB VAGINITIS PLUS (VG+)
Candida albicans, NAA: POSITIVE — AB
Candida glabrata, NAA: NEGATIVE
Chlamydia trachomatis, NAA: NEGATIVE
Neisseria gonorrhoeae, NAA: NEGATIVE
Trich vag by NAA: NEGATIVE

## 2021-09-02 ENCOUNTER — Other Ambulatory Visit: Payer: Self-pay

## 2021-10-04 ENCOUNTER — Other Ambulatory Visit: Payer: Self-pay

## 2021-10-08 ENCOUNTER — Other Ambulatory Visit: Payer: Self-pay

## 2021-10-08 DIAGNOSIS — N61 Mastitis without abscess: Secondary | ICD-10-CM | POA: Diagnosis not present

## 2021-10-08 DIAGNOSIS — N611 Abscess of the breast and nipple: Secondary | ICD-10-CM | POA: Diagnosis not present

## 2021-10-08 MED ORDER — SULFAMETHOXAZOLE-TRIMETHOPRIM 800-160 MG PO TABS
ORAL_TABLET | ORAL | 0 refills | Status: DC
  Filled 2021-10-08: qty 20, 10d supply, fill #0

## 2021-10-08 MED ORDER — TRAMADOL HCL 50 MG PO TABS
ORAL_TABLET | ORAL | 0 refills | Status: DC
  Filled 2021-10-08: qty 15, 5d supply, fill #0

## 2021-10-20 ENCOUNTER — Other Ambulatory Visit: Payer: Self-pay | Admitting: General Surgery

## 2021-10-20 ENCOUNTER — Other Ambulatory Visit: Payer: Self-pay

## 2021-10-20 DIAGNOSIS — N61 Mastitis without abscess: Secondary | ICD-10-CM | POA: Diagnosis not present

## 2021-10-20 DIAGNOSIS — F17200 Nicotine dependence, unspecified, uncomplicated: Secondary | ICD-10-CM | POA: Diagnosis not present

## 2021-10-20 MED ORDER — SULFAMETHOXAZOLE-TRIMETHOPRIM 800-160 MG PO TABS
ORAL_TABLET | ORAL | 0 refills | Status: DC
  Filled 2021-10-20 – 2021-11-15 (×2): qty 14, 7d supply, fill #0

## 2021-11-04 ENCOUNTER — Other Ambulatory Visit: Payer: Self-pay

## 2021-11-09 ENCOUNTER — Inpatient Hospital Stay: Admission: RE | Admit: 2021-11-09 | Payer: 59 | Source: Ambulatory Visit

## 2021-11-09 ENCOUNTER — Other Ambulatory Visit: Payer: 59

## 2021-11-15 ENCOUNTER — Other Ambulatory Visit: Payer: Self-pay

## 2021-11-18 DIAGNOSIS — G5603 Carpal tunnel syndrome, bilateral upper limbs: Secondary | ICD-10-CM | POA: Diagnosis not present

## 2021-12-08 ENCOUNTER — Other Ambulatory Visit: Payer: Self-pay

## 2021-12-09 ENCOUNTER — Other Ambulatory Visit: Payer: Self-pay

## 2021-12-09 MED ORDER — DOXYCYCLINE HYCLATE 100 MG PO CAPS
ORAL_CAPSULE | ORAL | 1 refills | Status: DC
  Filled 2021-12-09: qty 45, 45d supply, fill #0

## 2021-12-16 ENCOUNTER — Ambulatory Visit
Admission: RE | Admit: 2021-12-16 | Discharge: 2021-12-16 | Disposition: A | Discharge status: Home / Self Care | Payer: 59 | Source: Ambulatory Visit | Attending: General Surgery | Admitting: General Surgery

## 2021-12-16 ENCOUNTER — Other Ambulatory Visit: Payer: Self-pay | Admitting: General Surgery

## 2021-12-16 DIAGNOSIS — N61 Mastitis without abscess: Secondary | ICD-10-CM | POA: Insufficient documentation

## 2021-12-16 DIAGNOSIS — N611 Abscess of the breast and nipple: Secondary | ICD-10-CM | POA: Diagnosis not present

## 2021-12-17 ENCOUNTER — Other Ambulatory Visit: Payer: Self-pay | Admitting: General Surgery

## 2021-12-17 DIAGNOSIS — N611 Abscess of the breast and nipple: Secondary | ICD-10-CM

## 2021-12-17 DIAGNOSIS — R928 Other abnormal and inconclusive findings on diagnostic imaging of breast: Secondary | ICD-10-CM

## 2021-12-17 DIAGNOSIS — N63 Unspecified lump in unspecified breast: Secondary | ICD-10-CM

## 2021-12-28 ENCOUNTER — Encounter: Payer: Self-pay | Admitting: Family Medicine

## 2021-12-28 ENCOUNTER — Ambulatory Visit: Payer: Self-pay | Admitting: Family Medicine

## 2021-12-28 ENCOUNTER — Other Ambulatory Visit: Payer: Self-pay

## 2021-12-28 DIAGNOSIS — Z113 Encounter for screening for infections with a predominantly sexual mode of transmission: Secondary | ICD-10-CM

## 2021-12-28 DIAGNOSIS — Z202 Contact with and (suspected) exposure to infections with a predominantly sexual mode of transmission: Secondary | ICD-10-CM

## 2021-12-28 LAB — WET PREP FOR TRICH, YEAST, CLUE
Trichomonas Exam: NEGATIVE
Yeast Exam: NEGATIVE

## 2021-12-28 MED ORDER — DOXYCYCLINE HYCLATE 100 MG PO TABS: 100.0000 mg | ORAL_TABLET | Freq: Two times a day (BID) | ORAL | 0 refills | Status: DC

## 2021-12-28 MED ORDER — CEFTRIAXONE SODIUM 500 MG IJ SOLR
500.0000 mg | Freq: Once | INTRAMUSCULAR | Status: AC
  Administered 2021-12-28: 500 mg via INTRAMUSCULAR

## 2021-12-28 NOTE — Progress Notes (Signed)
[facility] Department  STI clinic/screening visit [place] [address] [phone]  Subjective:  Margaret Newman is a 41 y.o. female being seen today for an STI screening visit. The patient reports they do not have symptoms.  Patient reports that they do not desire a pregnancy in the next year.   They reported they are not interested in discussing contraception today.    Patient's last menstrual period was 12/08/2021 (exact date).   Patient has the following medical conditions:   Patient Active Problem List   Diagnosis Date Noted   Iron deficiency anemia 05/11/2020   Prediabetes 02/06/2020   Abnormal laboratory test 02/06/2020   Encounter to establish care 12/26/2019   History of carpal tunnel syndrome 12/26/2019   Ear drainage right 12/26/2019   Snoring 12/26/2019   Right ear impacted cerumen 12/26/2019   Hypertension 05/11/2019    Chief Complaint  Patient presents with   SEXUALLY TRANSMITTED DISEASE    Screening and treatment for Chlamydia and Gonorrhea    HPI  Patient reports she is here for STD evaluation and treatment for contact to GC/Chlamydia.  States that partner was treated yesterday. She denies symptoms.  States that she has 1 partner.  She has had recent treatment for breast mastitis with doxycycline, completed treatment 3 weeks ago.  Last HIV test per patient/review of record was 05/17/2019 Patient reports last pap was 09/04/2020  Screening for MPX risk: Does the patient have an unexplained rash? No Is the patient MSM? No Does the patient endorse multiple sex partners or anonymous sex partners? No Did the patient have close or sexual contact with a person diagnosed with MPX? No Has the patient traveled outside the [place] where MPX is endemic? No Is there a high clinical suspicion for MPX-- evidenced by one of the following No  -Unlikely to be chickenpox  -Lymphadenopathy  -Rash that present in same phase of evolution on  any given body part See flowsheet for further details and programmatic requirements.   Immunization history:  Immunization History  Administered Date(s) Administered   Influenza-Unspecified 05/19/2020   PFIZER(Purple Top)SARS-COV-2 Vaccination 01/27/2020, 02/24/2020   Tdap 05/15/2020     The following portions of the patient's history were reviewed and updated as appropriate: allergies, current medications, past medical history, past social history, past surgical history and problem list.  Objective:  There were no vitals filed for this visit.  Physical Exam Vitals and nursing note reviewed.  Constitutional:      Appearance: Normal appearance.  HENT:     Head: Normocephalic and atraumatic.     Mouth/Throat:     Mouth: Mucous membranes are moist.     Pharynx: Oropharynx is clear. No oropharyngeal exudate or posterior oropharyngeal erythema.  Pulmonary:     Effort: Pulmonary effort is normal.  Abdominal:     General: Abdomen is flat.     Palpations: There is no mass.     Tenderness: There is no abdominal tenderness. There is no rebound.  Genitourinary:    General: Normal vulva.     Exam position: Lithotomy position.     Pubic Area: No rash or pubic lice.      Labia:        Right: No rash or lesion.        Left: No rash or lesion.      Vagina: Normal. No vaginal discharge, erythema, bleeding or lesions.     Cervix: No cervical motion tenderness, discharge, friability, lesion or erythema.  Uterus: Normal.      Adnexa: Right adnexa normal and left adnexa normal.     Rectum: Normal.     Comments: pH = client self collected Lymphadenopathy:     Head:     Right side of head: No preauricular or posterior auricular adenopathy.     Left side of head: No preauricular or posterior auricular adenopathy.     Cervical: No cervical adenopathy.     Upper Body:     Right upper body: No supraclavicular, axillary or epitrochlear adenopathy.     Left upper body: No supraclavicular,  axillary or epitrochlear adenopathy.     Lower Body: No right inguinal adenopathy. No left inguinal adenopathy.  Skin:    General: Skin is warm and dry.     Findings: No rash.  Neurological:     Mental Status: She is alert and oriented to person, place, and time.      Assessment and Plan:  Margaret Newman is a 41 y.o. female presenting to the [facility] Department for STI screening  1. Screening examination for venereal disease Treat wet prep as per SO. - WET PREP FOR [place], YEAST, CLUE - [facility]  2. Exposure to gonorrhea  - cefTRIAXone (ROCEPHIN) injection 500 mg - doxycycline (VIBRA-TABS) 100 MG tablet; Take 1 tablet (100 mg total) by mouth 2 (two) times daily for 7 days.  Dispense: 14 tablet; Refill: 0 Co. Client to abstain from sex until she and partner complete treatment. Recommend condoms for STD prevention.  3. Chlamydia contact  - cefTRIAXone (ROCEPHIN) injection 500 mg - doxycycline (VIBRA-TABS) 100 MG tablet; Take 1 tablet (100 mg total) by mouth 2 (two) times daily for 7 days.  Dispense: 14 tablet; Refill: 0     Return if symptoms worsen or fail to improve.  Future Appointments  Date Time Provider [place]  12/31/2021  9:00 AM Cable, Alyson Locket, NP [facility]    Hassell Done, FNP

## 2021-12-28 NOTE — Progress Notes (Signed)
Pt here for STD screening and as a contact to NGU and Gonorrhea.  Wet mount results reviewed.  Ceftriaxone 500 mg given IM without any complications.  The patient was dispensed Doxycycline 100 mg today. I provided counseling today regarding the medication. We discussed the medication, the side effects and when to call clinic. Patient given the opportunity to ask questions. Questions answered.  Condoms declined.

## 2021-12-31 ENCOUNTER — Ambulatory Visit: Payer: 59 | Admitting: Nurse Practitioner

## 2022-01-04 ENCOUNTER — Other Ambulatory Visit: Payer: Self-pay

## 2022-01-04 ENCOUNTER — Other Ambulatory Visit: Payer: Self-pay | Admitting: Family Medicine

## 2022-01-04 DIAGNOSIS — Z202 Contact with and (suspected) exposure to infections with a predominantly sexual mode of transmission: Secondary | ICD-10-CM

## 2022-01-04 MED ORDER — DOXYCYCLINE HYCLATE 100 MG PO TABS: 100.0000 mg | ORAL_TABLET | Freq: Two times a day (BID) | ORAL | 0 refills | Status: AC

## 2022-01-12 ENCOUNTER — Ambulatory Visit
Admission: RE | Admit: 2022-01-12 | Discharge: 2022-01-12 | Disposition: A | Discharge status: Home / Self Care | Payer: 59 | Source: Ambulatory Visit | Attending: General Surgery | Admitting: General Surgery

## 2022-01-12 DIAGNOSIS — N611 Abscess of the breast and nipple: Secondary | ICD-10-CM | POA: Insufficient documentation

## 2022-01-12 DIAGNOSIS — R928 Other abnormal and inconclusive findings on diagnostic imaging of breast: Secondary | ICD-10-CM | POA: Insufficient documentation

## 2022-01-20 ENCOUNTER — Encounter: Payer: Self-pay | Admitting: Oncology

## 2022-01-20 ENCOUNTER — Other Ambulatory Visit: Payer: Self-pay

## 2022-01-20 MED ORDER — MELOXICAM 15 MG PO TABS
ORAL_TABLET | ORAL | 0 refills | Status: DC
  Filled 2022-01-20: qty 30, 30d supply, fill #0

## 2022-01-24 ENCOUNTER — Encounter: Payer: Self-pay | Admitting: Oncology

## 2022-01-31 ENCOUNTER — Ambulatory Visit: Payer: 59 | Admitting: Nurse Practitioner

## 2022-02-01 ENCOUNTER — Encounter: Payer: Self-pay | Admitting: Oncology

## 2022-02-01 ENCOUNTER — Other Ambulatory Visit: Payer: Self-pay

## 2022-02-01 MED ORDER — DOXYCYCLINE HYCLATE 100 MG PO CAPS
ORAL_CAPSULE | ORAL | 0 refills | Status: DC
  Filled 2022-02-01: qty 45, 45d supply, fill #0

## 2022-02-03 ENCOUNTER — Other Ambulatory Visit: Payer: Self-pay

## 2022-02-11 ENCOUNTER — Other Ambulatory Visit: Payer: Self-pay

## 2022-02-11 ENCOUNTER — Encounter: Payer: Self-pay | Admitting: Oncology

## 2022-02-15 ENCOUNTER — Other Ambulatory Visit: Payer: Self-pay

## 2022-02-15 ENCOUNTER — Ambulatory Visit: Payer: Self-pay | Admitting: General Surgery

## 2022-02-15 ENCOUNTER — Encounter: Payer: Self-pay | Admitting: Oncology

## 2022-02-15 MED ORDER — DOXYCYCLINE MONOHYDRATE 100 MG PO CAPS
ORAL_CAPSULE | ORAL | 0 refills | Status: DC
  Filled 2022-02-15: qty 20, 10d supply, fill #0

## 2022-02-15 MED ORDER — HYDROCODONE-ACETAMINOPHEN 5-325 MG PO TABS
ORAL_TABLET | ORAL | 0 refills | Status: DC
  Filled 2022-02-15: qty 10, 2d supply, fill #0

## 2022-02-15 NOTE — H&P (Signed)
HISTORY OF PRESENT ILLNESS:    Margaret Newman is a 41 y.o.female patient who comes for evaluation of right breast infected mass.  The patient endorses that she has been having pain on the right breast periareolar area since last Friday.  She endorses the pain is localized to the right breast.  Pain aggravated by applying pressure.  There has been no alleviating factors.  There is no pain radiation.  Patient has not taken any antibiotic therapy.  Patient has been previously evaluated by dermatology and she has been recommended to start with Humira.  She has been having long course antibiotic with doxycycline with some control of the disease but continued to recur.  Patient endorses she continues smoking but she is trying to decrease her nicotine consumption.      PAST MEDICAL HISTORY:  Past Medical History:  Diagnosis Date   Hypertension         PAST SURGICAL HISTORY:   Past Surgical History:  Procedure Laterality Date   BREAST EXCISIONAL BIOPSY Right 08/07/2020   Dr Lesli Albee         MEDICATIONS:  Outpatient Encounter Medications as of 02/15/2022  Medication Sig Dispense Refill   amLODIPine (NORVASC) 10 MG tablet Take 1 tablet by mouth once daily     chlorthalidone 25 MG tablet Take 1 tablet by mouth once daily     ciprofloxacin-dexAMETHasone (CIPRODEX) otic suspension      doxycycline (VIBRAMYCIN) 100 MG capsule doxycycline hyclate 100 mg capsule     ibuprofen (MOTRIN) 800 MG tablet Take by mouth as needed     meloxicam (MOBIC) 15 MG tablet Take 1 tablet every day by oral route.     naproxen (NAPROSYN) 500 MG tablet      SSD 1 % cream      traMADoL (ULTRAM) 50 mg tablet Take 1 tablet by mouth every 8 hours as needed for up to 5 days     doxycycline (MONODOX) 100 MG capsule Take 1 capsule (100 mg total) by mouth 2 (two) times daily for 10 days 20 capsule 0   HYDROcodone-acetaminophen (NORCO) 5-325 mg tablet  (Patient not taking: Reported on 02/15/2022)      HYDROcodone-acetaminophen (NORCO) 5-325 mg tablet Take 1 tablet by mouth every 4 (four) hours as needed for Pain 10 tablet 0   No facility-administered encounter medications on file as of 02/15/2022.     ALLERGIES:   Patient has no known allergies.   SOCIAL HISTORY:  Social History   Socioeconomic History   Marital status: Single  Tobacco Use   Smoking status: Every Day   Smokeless tobacco: Never  Vaping Use   Vaping Use: Never used  Substance and Sexual Activity   Alcohol use: Never   Drug use: Never    FAMILY HISTORY:  Family History  Problem Relation Age of Onset   High blood pressure (Hypertension) Mother    Diabetes Maternal Aunt    Dementia Maternal Grandmother    High blood pressure (Hypertension) Maternal Grandmother    Breast cancer Paternal Grandmother      GENERAL REVIEW OF SYSTEMS:   General ROS: negative for - chills, fatigue, fever, weight gain or weight loss Allergy and Immunology ROS: negative for - hives  Hematological and Lymphatic ROS: negative for - bleeding problems or bruising, negative for palpable nodes Endocrine ROS: negative for - heat or cold intolerance, hair changes Respiratory ROS: negative for - cough, shortness of breath or wheezing Cardiovascular ROS: no chest pain or palpitations  GI ROS: negative for nausea, vomiting, abdominal pain, diarrhea, constipation Musculoskeletal ROS: negative for - joint swelling or muscle pain Neurological ROS: negative for - confusion, syncope Dermatological ROS: negative for pruritus and rash  PHYSICAL EXAM:  Vitals:   02/15/22 1434  BP: (!) 140/89  Pulse: 73  .  Ht:157.5 cm ('5\' 2"'$ ) Wt:89.4 kg (197 lb) MHW:KGSU surface area is 1.98 meters squared. Body mass index is 36.03 kg/m.Marland Kitchen   GENERAL: Alert, active, oriented x3  HEENT: Pupils equal reactive to light. Extraocular movements are intact. Sclera clear. Palpebral conjunctiva normal red color.Pharynx clear.  NECK: Supple with no palpable mass and  no adenopathy.  LUNGS: Sound clear with no rales rhonchi or wheezes.  HEART: Regular rhythm S1 and S2 without murmur.  BREAST: Right breast area of induration on the medial portion of the areola.  This is indurated and tender to palpation.  This seems to be deep to the breast tissue.  Not a superficial abscess amenable for bedside drainage.  ABDOMEN: Soft and depressible, nontender with no palpable mass, no hepatomegaly.   EXTREMITIES: Well-developed well-nourished symmetrical with no dependent edema.  NEUROLOGICAL: Awake alert oriented, facial expression symmetrical, moving all extremities.  I personally evaluated the diagnostic mammogram and ultrasound done on July 6.  She was found with 1.7 cm superficial upper inner retroareolar right breast collection.  No other sign of malignancy.  She was scheduled for aspiration of the 2 cm fluid collection but it was aborted due to being so superficial.      IMPRESSION:     Hidradenitis suppurativa [L73.2] -Recurrent hidradenitis with abscess of the right breast.  Patient has had a long history of doxycycline therapy with some control but not completely able to be controlled. -She has discussed with dermatology possible start of Humira. -I discussed with patient about recommendation of excision of current disease.  Discussed with patient the possibility of needing open wound if significant infection is identified.  This open incision will need to heal by secondary intention with wet-to-dry packings.  If adequate, noninfected clean breast tissue is able to be left, we can try to consider closure or partial closure of the wound. The patient reports she understood and agreed to proceed.      PLAN:  Excision of right breast soft tissue mass versus deep abscess ([address]) Highly recommended to stop smoking to decrease the risk of recurrence of these infections Avoid taking aspirin or blood thinner before the surgery Contact us if you have any  concern  Patient verbalized understanding, all questions were answered, and were agreeable with the plan outlined above.   Herbert Pun, MD  Electronically signed by Herbert Pun, MD

## 2022-02-16 ENCOUNTER — Encounter: Payer: Self-pay | Admitting: Oncology

## 2022-02-18 ENCOUNTER — Inpatient Hospital Stay: Admission: RE | Admit: 2022-02-18 | Payer: Self-pay | Source: Ambulatory Visit

## 2022-03-13 DIAGNOSIS — G5601 Carpal tunnel syndrome, right upper limb: Secondary | ICD-10-CM

## 2022-03-13 HISTORY — DX: Carpal tunnel syndrome, right upper limb: G56.01

## 2022-03-17 ENCOUNTER — Inpatient Hospital Stay
Admission: RE | Admit: 2022-03-17 | Discharge: 2022-03-17 | Disposition: A | Discharge status: Home / Self Care | Payer: Self-pay | Source: Ambulatory Visit

## 2022-03-18 ENCOUNTER — Inpatient Hospital Stay: Admission: RE | Admit: 2022-03-18 | Payer: Self-pay | Source: Ambulatory Visit

## 2022-03-18 HISTORY — DX: Anemia, unspecified: D64.9

## 2022-03-21 ENCOUNTER — Inpatient Hospital Stay
Admission: RE | Admit: 2022-03-21 | Discharge: 2022-03-21 | Disposition: A | Discharge status: Home / Self Care | Payer: Self-pay | Source: Ambulatory Visit

## 2022-03-21 NOTE — Pre-Procedure Instructions (Signed)
Pt has been called multiple times to do her anesthesia interview and her vm is full so unable to leave pt a message. Reached out to Dr Einar Crow office and left message with his nurse Tomasita Crumble and informed her of the multiple attempts and to see if their office can try to contact pt and let her know that she needs to call us to do her interview bc she will have to come into [place] for labs and ekg

## 2022-03-23 ENCOUNTER — Inpatient Hospital Stay
Admission: RE | Admit: 2022-03-23 | Discharge: 2022-03-23 | Disposition: A | Discharge status: Home / Self Care | Payer: Self-pay | Source: Ambulatory Visit

## 2022-03-23 NOTE — Pre-Procedure Instructions (Signed)
Pt never called back. Labs and ekg will have to be done dos-chart sent up to sds

## 2022-03-23 NOTE — Pre-Procedure Instructions (Signed)
Tried pt again today to do her anesthesia interview. Vm full and cannot leave a message. I called over to dr Einar Crow office and spoke with Nicole Kindred again to let her know that we still cannot get in touch with pt. She states she called pts mom and told her she needed to call us back. Judeen Hammans said pt is very hard to get in touch with and that she will try pt again. I told Judeen Hammans if pt does not call back today we are not reaching out to pt anymore and her chart will be sent up to [facility] and her labs and ekg will have to be done [facility]

## 2022-03-24 ENCOUNTER — Encounter: Payer: Self-pay | Admitting: Oncology

## 2022-03-24 MED ORDER — CHLORHEXIDINE GLUCONATE 0.12 % MT SOLN: 15.0000 mL | Freq: Once | OROMUCOSAL | Status: DC

## 2022-03-24 MED ORDER — FAMOTIDINE 20 MG PO TABS: 20.0000 mg | ORAL_TABLET | Freq: Once | ORAL | Status: DC

## 2022-03-24 MED ORDER — ORAL CARE MOUTH RINSE: 15.0000 mL | Freq: Once | OROMUCOSAL | Status: DC

## 2022-03-24 MED ORDER — LACTATED RINGERS IV SOLN: INTRAVENOUS | Status: DC

## 2022-03-24 MED ORDER — CEFAZOLIN SODIUM-DEXTROSE 2-4 GM/100ML-% IV SOLN: 2.0000 g | INTRAVENOUS | Status: DC

## 2022-03-25 ENCOUNTER — Encounter: Admission: RE | Payer: Self-pay | Source: Home / Self Care

## 2022-03-25 ENCOUNTER — Ambulatory Visit: Admission: RE | Admit: 2022-03-25 | Payer: 59 | Source: Home / Self Care | Admitting: General Surgery

## 2022-03-25 DIAGNOSIS — D509 Iron deficiency anemia, unspecified: Secondary | ICD-10-CM

## 2022-03-25 DIAGNOSIS — I10 Essential (primary) hypertension: Secondary | ICD-10-CM

## 2022-03-25 DIAGNOSIS — Z79899 Other long term (current) drug therapy: Secondary | ICD-10-CM

## 2022-03-25 DIAGNOSIS — Z01818 Encounter for other preprocedural examination: Secondary | ICD-10-CM

## 2022-03-25 SURGERY — INCISION AND DRAINAGE, ABSCESS
Anesthesia: General | Laterality: Right

## 2022-04-05 ENCOUNTER — Ambulatory Visit: Payer: Self-pay | Admitting: Advanced Practice Midwife

## 2022-04-05 ENCOUNTER — Encounter: Payer: Self-pay | Admitting: Advanced Practice Midwife

## 2022-04-05 DIAGNOSIS — R87610 Atypical squamous cells of undetermined significance on cytologic smear of cervix (ASC-US): Secondary | ICD-10-CM

## 2022-04-05 DIAGNOSIS — F17200 Nicotine dependence, unspecified, uncomplicated: Secondary | ICD-10-CM

## 2022-04-05 DIAGNOSIS — Z113 Encounter for screening for infections with a predominantly sexual mode of transmission: Secondary | ICD-10-CM

## 2022-04-05 DIAGNOSIS — F1290 Cannabis use, unspecified, uncomplicated: Secondary | ICD-10-CM

## 2022-04-05 DIAGNOSIS — R87619 Unspecified abnormal cytological findings in specimens from cervix uteri: Secondary | ICD-10-CM | POA: Insufficient documentation

## 2022-04-05 LAB — WET PREP FOR TRICH, YEAST, CLUE
Trichomonas Exam: NEGATIVE
Yeast Exam: NEGATIVE

## 2022-04-05 LAB — HM HIV SCREENING LAB: HM HIV Screening: NEGATIVE

## 2022-04-05 MED ORDER — METRONIDAZOLE 500 MG PO TABS: 500.0000 mg | ORAL_TABLET | Freq: Two times a day (BID) | ORAL | 0 refills | Status: DC

## 2022-04-05 NOTE — Progress Notes (Signed)
In House labs reviewed during visit.  The patient was dispensed Metronidazole po  today. I provided counseling today regarding the medication. We discussed the medication, the side effects and when to call clinic. Patient given the opportunity to ask questions. Questions answered. BThiele RN

## 2022-04-05 NOTE — Progress Notes (Signed)
[name]  STI clinic/screening visit [place] [address] [phone]  Subjective:  Margaret Newman is a 41 y.o. female being seen today for an STI screening visit. The patient reports they do have symptoms.  Patient reports that they do not desire a pregnancy in the next year.   They reported they are not interested in discussing contraception today.    Patient's last menstrual period was 03/20/2022 (approximate).   Patient has the following medical conditions:   Patient Active Problem List   Diagnosis Date Noted   Abnormal Pap smear of cervix 09/04/20 ASCUS HPV- 04/05/2022   Smoker 1 1/2 ppd 04/05/2022   Marijuana use 04/05/2022   Iron deficiency anemia 05/11/2020   Prediabetes 02/06/2020   Abnormal laboratory test 02/06/2020   Encounter to establish care 12/26/2019   History of carpal tunnel syndrome 12/26/2019   Ear drainage right 12/26/2019   Snoring 12/26/2019   Right ear impacted cerumen 12/26/2019   Hypertension 05/11/2019    Chief Complaint  Patient presents with   STI Screening    HPI  Patient reports c/o vaginal malodor x 3 wks. Last sex last night without condom; with current partner x 3 years; 1 partner in last 3 mo. LMP 03/20/22. Last MJ this am. Smoking 1 1/2 ppd. Last vaped 02/2022. Last ETOH 01/2022 (2 glasses wine) q 3-4 mo.  Last pap 09/04/20 ASCUS HPV-  Does the patient using douching products? No  Last HIV test per patient/review of record was  Lab Results  Component Value Date   HMHIVSCREEN Negative - Validated 05/17/2019   No results found for: "HIV" Patient reports last pap was  Lab Results  Component Value Date   DIAGPAP (A) 09/04/2020    - Atypical squamous cells of undetermined significance (ASC-US)     Screening for MPX risk: Does the patient have an unexplained rash? No Is the patient MSM? No Does the patient endorse multiple sex partners or anonymous sex partners? No Did the patient  have close or sexual contact with a person diagnosed with MPX? No Has the patient traveled outside the [place] where MPX is endemic? No Is there a high clinical suspicion for MPX-- evidenced by one of the following No  -Unlikely to be chickenpox  -Lymphadenopathy  -Rash that present in same phase of evolution on any given body part See flowsheet for further details and programmatic requirements.   Immunization history:  Immunization History  Administered Date(s) Administered   Influenza-Unspecified 05/19/2020   PFIZER(Purple Top)SARS-COV-2 Vaccination 01/27/2020, 02/24/2020   Tdap 05/15/2020     The following portions of the patient's history were reviewed and updated as appropriate: allergies, current medications, past medical history, past social history, past surgical history and problem list.  Objective:  There were no vitals filed for this visit.  Physical Exam Vitals and nursing note reviewed.  Constitutional:      Appearance: Normal appearance. She is obese.  HENT:     Head: Normocephalic and atraumatic.     Mouth/Throat:     Mouth: Mucous membranes are moist.     Pharynx: Oropharynx is clear. No oropharyngeal exudate or posterior oropharyngeal erythema.  Eyes:     Conjunctiva/sclera: Conjunctivae normal.  Pulmonary:     Effort: Pulmonary effort is normal.  Abdominal:     Palpations: Abdomen is soft. There is no mass.     Tenderness: There is no abdominal tenderness. There is no rebound.     Comments: Umbilical hernia, increased  adipose, poor tone, soft without tenderness  Genitourinary:    General: Normal vulva.     Exam position: Lithotomy position.     Pubic Area: No rash or pubic lice.      Labia:        Right: No rash or lesion.        Left: No rash or lesion.      Vagina: Vaginal discharge (white creamy leukorrhea, ph>4.5) present. No erythema, bleeding or lesions.     Cervix: Normal.     Uterus: Normal.      Adnexa: Right adnexa normal and left adnexa normal.      Rectum: Normal.     Comments: pH = >4.5 Lymphadenopathy:     Head:     Right side of head: No preauricular or posterior auricular adenopathy.     Left side of head: No preauricular or posterior auricular adenopathy.     Cervical: No cervical adenopathy.     Right cervical: No superficial, deep or posterior cervical adenopathy.    Left cervical: No superficial, deep or posterior cervical adenopathy.     Upper Body:     Right upper body: No supraclavicular, axillary or epitrochlear adenopathy.     Left upper body: No supraclavicular, axillary or epitrochlear adenopathy.     Lower Body: No right inguinal adenopathy. No left inguinal adenopathy.  Skin:    General: Skin is warm and dry.     Findings: No rash.  Neurological:     Mental Status: She is alert and oriented to person, place, and time.      Assessment and Plan:  Margaret Newman is a 41 y.o. female presenting to the [facility] Department for STI screening  1. Screening examination for venereal disease Treat wet mount per standing orders Immunization nurse consult - [facility] - Syphilis Serology, [facility] - Gonococcus culture - WET PREP FOR [place], YEAST, CLUE  2. Atypical squamous cells of undetermined significance on cytologic smear of cervix (ASC-US) HPV- F/u with primary care MD  3. Smoker 1 1/2 ppd Counseled via 5 A's to stop  4. Marijuana use      No follow-ups on file.  Future Appointments  Date Time Provider [place]  04/12/2022  3:00 PM [facility] None    Herbie Saxon, CNM

## 2022-04-10 LAB — GONOCOCCUS CULTURE

## 2022-04-12 ENCOUNTER — Encounter
Admission: RE | Admit: 2022-04-12 | Discharge: 2022-04-12 | Disposition: A | Discharge status: Home / Self Care | Payer: 59 | Source: Ambulatory Visit | Attending: Specialist | Admitting: Specialist

## 2022-04-12 VITALS — Ht 63.0 in | Wt 196.0 lb

## 2022-04-12 DIAGNOSIS — I10 Essential (primary) hypertension: Secondary | ICD-10-CM

## 2022-04-12 DIAGNOSIS — Z01812 Encounter for preprocedural laboratory examination: Secondary | ICD-10-CM

## 2022-04-12 HISTORY — DX: Iron deficiency anemia, unspecified: D50.9

## 2022-04-12 HISTORY — DX: Prediabetes: R73.03

## 2022-04-12 NOTE — Patient Instructions (Addendum)
Your procedure is scheduled on: Thursday, November 9 Report to the Registration Desk on the 1st floor of the [facility]. To find out your arrival time, please call [phone] between 1PM - 3PM on: Wednesday, November 8 If your arrival time is 6:00 am, do not arrive prior to that time as the [facility] entrance doors do not open until 6:00 am.  REMEMBER: Instructions that are not followed completely may result in serious medical risk, up to and including death; or upon the discretion of your surgeon and anesthesiologist your surgery may need to be rescheduled.  Do not eat food after midnight the night before surgery.  No gum chewing, lozengers or hard candies.  You may however, drink CLEAR liquids up to 2 hours before you are scheduled to arrive for your surgery. Do not drink anything within 2 hours of your scheduled arrival time.  Clear liquids include: - water  - apple juice without pulp - gatorade (not RED colors) - black coffee or tea (Do NOT add milk or creamers to the coffee or tea) Do NOT drink anything that is not on this list.  TAKE THESE MEDICATIONS THE MORNING OF SURGERY WITH A SIP OF WATER:  Amlodipine  One week prior to surgery: starting November 2 Stop MELOXICAM AND Anti-inflammatories (NSAIDS) such as Advil, Aleve, Ibuprofen, Motrin, Naproxen, Naprosyn and Aspirin based products such as Excedrin, Goodys Powder, BC Powder. Stop ANY OVER THE COUNTER supplements until after surgery. You may however, continue to take Tylenol if needed for pain up until the day of surgery.  No Alcohol for 24 hours before or after surgery.  No Smoking including e-cigarettes for 24 hours prior to surgery.  No chewable tobacco products for at least 6 hours prior to surgery.  No nicotine patches on the day of surgery.  Do not use any "recreational" drugs for at least a week prior to your surgery.  Please be advised that the combination of cocaine and anesthesia may have negative  outcomes, up to and including death. If you test positive for cocaine, your surgery will be cancelled.  On the morning of surgery brush your teeth with toothpaste and water, you may rinse your mouth with mouthwash if you wish. Do not swallow any toothpaste or mouthwash.  Use CHG Soap as directed on instruction sheet.  Do not wear jewelry, make-up, hairpins, clips or nail polish.  Do not wear lotions, powders, or perfumes.   Do not shave body from the neck down 48 hours prior to surgery just in case you cut yourself which could leave a site for infection.  Also, freshly shaved skin may become irritated if using the CHG soap.  Contact lenses, hearing aids and dentures may not be worn into surgery.  Do not bring valuables to the hospital. [facility] is not responsible for any missing/lost belongings or valuables.   Notify your doctor if there is any change in your medical condition (cold, fever, infection).  Wear comfortable clothing (specific to your surgery type) to the hospital.  After surgery, you can help prevent lung complications by doing breathing exercises.  Take deep breaths and cough every 1-2 hours. Your doctor may order a device called an Incentive Spirometer to help you take deep breaths.  If you are being discharged the day of surgery, you will not be allowed to drive home. You will need a responsible adult (18 years or older) to drive you home and stay with you that night.   If you are  taking public transportation, you will need to have a responsible adult (18 years or older) with you. Please confirm with your physician that it is acceptable to use public transportation.   Please call the [place] Dept. at [phone] if you have any questions about these instructions.  Surgery Visitation Policy:  Patients undergoing a surgery or procedure may have two family members or support persons with them as long as the person is not COVID-19 positive or  experiencing its symptoms.      Preparing for Surgery with CHLORHEXIDINE GLUCONATE (CHG) Soap  Chlorhexidine Gluconate (CHG) Soap  o An antiseptic cleaner that kills germs and bonds with the skin to continue killing germs even after washing  o Used for showering the night before surgery and morning of surgery  Before surgery, you can play an important role by reducing the number of germs on your skin.  CHG (Chlorhexidine gluconate) soap is an antiseptic cleanser which kills germs and bonds with the skin to continue killing germs even after washing.  Please do not use if you have an allergy to CHG or antibacterial soaps. If your skin becomes reddened/irritated stop using the CHG.  1. Shower the NIGHT BEFORE SURGERY and the MORNING OF SURGERY with CHG soap.  2. If you choose to wash your hair, wash your hair first as usual with your normal shampoo.  3. After shampooing, rinse your hair and body thoroughly to remove the shampoo.  4. Use CHG as you would any other liquid soap. You can apply CHG directly to the skin and wash gently with a scrungie or a clean washcloth.  5. Apply the CHG soap to your body only from the neck down. Do not use on open wounds or open sores. Avoid contact with your eyes, ears, mouth, and genitals (private parts). Wash face and genitals (private parts) with your normal soap.  6. Wash thoroughly, paying special attention to the area where your surgery will be performed.  7. Thoroughly rinse your body with warm water.  8. Do not shower/wash with your normal soap after using and rinsing off the CHG soap.  9. Pat yourself dry with a clean towel.  10. Wear clean pajamas to bed the night before surgery.  12. Place clean sheets on your bed the night of your first shower and do not sleep with pets.  13. Shower again with the CHG soap on the day of surgery prior to arriving at the hospital.  14. Do not apply any deodorants/lotions/powders.  15. Please wear  clean clothes to the hospital.

## 2022-04-13 ENCOUNTER — Other Ambulatory Visit: Payer: Self-pay | Admitting: Specialist

## 2022-04-13 NOTE — H&P (Signed)
PREOPERATIVE H&P  Chief Complaint: G56.01 Carpal tunnel syndrome, right hand  HPI: Margaret Newman is a 41 y.o. female who presents for preoperative history and physical with a diagnosis of G56.01 Carpal tunnel syndrome, right hand.  Symptoms are rated as moderate to severe, and have been worsening.  Nerve conduction studies were positive for moderate to severe carpal tunnel syndrome.  Conservative treatment has failed to correct the problem.  This is significantly impairing activities of daily living.  She has elected for surgical management.   Past Medical History:  Diagnosis Date   Anemia    Asthma    as a child- no inhalers   Carpal tunnel syndrome on right 03/2022   Hypertension    Iron deficiency anemia    Pre-diabetes    Past Surgical History:  Procedure Laterality Date   BREAST BIOPSY Right 08/07/2020   excision for infection per pt   ECTOPIC PREGNANCY SURGERY  2001   EXCISION OF BREAST BIOPSY Right 08/07/2020   Procedure: EXCISION OF BREAST BIOPSY;  Surgeon: Herbert Pun, MD;  Location: [facility] ORS;  Service: General;  Laterality: Right;   Social History   Socioeconomic History   Marital status: Single    Spouse name: Not on file   Number of children: 1   Years of education: Not on file   Highest education level: Not on file  Occupational History   Not on file  Tobacco Use   Smoking status: Every Day    Packs/day: 0.50    Years: 22.00    Total pack years: 11.00    Types: Cigarettes, E-cigarettes    Passive exposure: Current   Smokeless tobacco: Never  Vaping Use   Vaping Use: Former  Substance and Sexual Activity   Alcohol use: Yes    Alcohol/week: 2.0 standard drinks of alcohol    Types: 2 Glasses of wine per week    Comment: occassional   Drug use: Yes    Types: Marijuana    Comment: daily   Sexual activity: Yes    Partners: Male    Birth control/protection: None  Other Topics Concern   Not on file  Social History Narrative   Lives with  son   Social Determinants of Health   Financial Resource Strain: Not on file  Food Insecurity: Not on file  Transportation Needs: Not on file  Physical Activity: Not on file  Stress: Not on file  Social Connections: Not on file   Family History  Problem Relation Age of Onset   Breast cancer Paternal Grandmother    No Known Allergies Prior to Admission medications   Medication Sig Start Date End Date Taking? Authorizing Provider  amLODipine (NORVASC) 10 MG tablet TAKE ONE TABLET BY MOUTH EVERY DAY 05/11/21 05/11/22  Iloabachie, Chioma E, NP  chlorthalidone (HYGROTON) 25 MG tablet TAKE ONE TABLET BY MOUTH EVERY DAY 05/11/21 04/12/22  Iloabachie, Chioma E, NP  meloxicam (MOBIC) 15 MG tablet Take 1 tablet every day by oral route. Patient taking differently: Take 15 mg by mouth 2 (two) times daily as needed. 01/20/22     metroNIDAZOLE (FLAGYL) 500 MG tablet Take 1 tablet (500 mg total) by mouth 2 (two) times daily. 04/05/22   Caren Macadam, MD  ferrous sulfate 325 (65 FE) MG tablet Take 1 tablet (325 mg total) by mouth daily with breakfast. Patient not taking: No sig reported 04/30/20 09/04/20  Iloabachie, Chioma E, NP     Positive ROS: All other systems have been reviewed and were  otherwise negative with the exception of those mentioned in the HPI and as above.  Physical Exam: General: Alert, no acute distress Cardiovascular: No pedal edema. Heart is regular and without murmur.  Respiratory: No cyanosis, no use of accessory musculature. Lungs are clear. GI: No organomegaly, abdomen is soft and non-tender Skin: No lesions in the area of chief complaint Neurologic: Sensation intact distally Psychiatric: Patient is competent for consent with normal mood and affect Lymphatic: No axillary or cervical lymphadenopathy  MUSCULOSKELETAL: Positive median nerve compression test on the right.  Pinch is weak.  Gross sensation is intact.  Thenar muscles are intact.  Grip is good.  The  skin is intact.  Assessment: G56.01 Carpal tunnel syndrome, right hand Plan: Plan for Procedure(s): CARPAL TUNNEL RELEASE right hand  The risks benefits and alternatives were discussed with the patient including but not limited to the risks of nonoperative treatment, versus surgical intervention including infection, bleeding, nerve injury,  blood clots, cardiopulmonary complications, morbidity, mortality, among others, and they were willing to proceed.   Park Breed, MD [phone]   04/13/2022 12:30 PM

## 2022-04-15 ENCOUNTER — Encounter: Payer: Self-pay | Admitting: Urgent Care

## 2022-04-15 ENCOUNTER — Inpatient Hospital Stay: Admission: RE | Admit: 2022-04-15 | Payer: 59 | Source: Ambulatory Visit

## 2022-04-20 ENCOUNTER — Other Ambulatory Visit: Payer: 59

## 2022-04-24 ENCOUNTER — Encounter (INDEPENDENT_AMBULATORY_CARE_PROVIDER_SITE_OTHER): Payer: Self-pay

## 2022-05-07 ENCOUNTER — Other Ambulatory Visit: Payer: Self-pay | Admitting: Specialist

## 2022-05-07 NOTE — H&P (Signed)
PREOPERATIVE H&P  Chief Complaint: G56.01 Carpal tunnel syndrome, right upper limb  HPI: Margaret Newman is a 41 y.o. female who presents for preoperative history and physical with a diagnosis of G56.03 Carpal tunnel syndrome, bilateral upper limbs. Symptoms are rated as moderate to severe, and have been worsening.  The patient has failed conservative treatment with splinting, injections, and medication.  This is significantly impairing activities of daily living.  She has elected for surgical management.   Past Medical History:  Diagnosis Date   Anemia    Asthma    as a child- no inhalers   Carpal tunnel syndrome on right 03/2022   Hypertension    Iron deficiency anemia    Pre-diabetes    Past Surgical History:  Procedure Laterality Date   BREAST BIOPSY Right 08/07/2020   excision for infection per pt   ECTOPIC PREGNANCY SURGERY  2001   EXCISION OF BREAST BIOPSY Right 08/07/2020   Procedure: EXCISION OF BREAST BIOPSY;  Surgeon: Herbert Pun, MD;  Location: [facility] ORS;  Service: General;  Laterality: Right;   Social History   Socioeconomic History   Marital status: Single    Spouse name: Not on file   Number of children: 1   Years of education: Not on file   Highest education level: Not on file  Occupational History   Not on file  Tobacco Use   Smoking status: Every Day    Packs/day: 0.50    Years: 22.00    Total pack years: 11.00    Types: Cigarettes, E-cigarettes    Passive exposure: Current   Smokeless tobacco: Never  Vaping Use   Vaping Use: Former  Substance and Sexual Activity   Alcohol use: Yes    Alcohol/week: 2.0 standard drinks of alcohol    Types: 2 Glasses of wine per week    Comment: occassional   Drug use: Yes    Types: Marijuana    Comment: daily   Sexual activity: Yes    Partners: Male    Birth control/protection: None  Other Topics Concern   Not on file  Social History Narrative   Lives with son   Social Determinants of Health    Financial Resource Strain: Not on file  Food Insecurity: Not on file  Transportation Needs: Not on file  Physical Activity: Not on file  Stress: Not on file  Social Connections: Not on file   Family History  Problem Relation Age of Onset   Breast cancer Paternal Grandmother    No Known Allergies Prior to Admission medications   Medication Sig Start Date End Date Taking? Authorizing Provider  amLODipine (NORVASC) 10 MG tablet TAKE ONE TABLET BY MOUTH EVERY DAY 05/11/21 05/11/22  Iloabachie, Chioma E, NP  chlorthalidone (HYGROTON) 25 MG tablet TAKE ONE TABLET BY MOUTH EVERY DAY 05/11/21 04/12/22  Iloabachie, Chioma E, NP  meloxicam (MOBIC) 15 MG tablet Take 1 tablet every day by oral route. Patient taking differently: Take 15 mg by mouth 2 (two) times daily as needed. 01/20/22     metroNIDAZOLE (FLAGYL) 500 MG tablet Take 1 tablet (500 mg total) by mouth 2 (two) times daily. 04/05/22   Caren Macadam, MD  ferrous sulfate 325 (65 FE) MG tablet Take 1 tablet (325 mg total) by mouth daily with breakfast. Patient not taking: No sig reported 04/30/20 09/04/20  Iloabachie, Chioma E, NP     Positive ROS: All other systems have been reviewed and were otherwise negative with the exception of those mentioned in  the HPI and as above.  Physical Exam: General: Alert, no acute distress Cardiovascular: No pedal edema. Heart is regular and without murmur.  Respiratory: No cyanosis, no use of accessory musculature. Lungs are clear. GI: No organomegaly, abdomen is soft and non-tender Skin: No lesions in the area of chief complaint Neurologic: Sensation intact distally Psychiatric: Patient is competent for consent with normal mood and affect Lymphatic: No axillary or cervical lymphadenopathy  MUSCULOSKELETAL: Right hand shows a positive median nerve compression test.  Pinch is slightly weak.  There is no thenar wasting.  Neuro neurovascular status shows slightly equal  sensation.  Assessment: G56.01 Carpal tunnel syndrome, right upper limb Plan: Plan for Procedure(s): CARPAL TUNNEL RELEASE right  The risks benefits and alternatives were discussed with the patient including but not limited to the risks of nonoperative treatment, versus surgical intervention including infection, bleeding, nerve injury,  blood clots, cardiopulmonary complications, morbidity, mortality, among others, and they were willing to proceed.   Park Breed, MD [phone]   05/07/2022 1:13 PM

## 2022-05-20 ENCOUNTER — Other Ambulatory Visit: Payer: Self-pay | Admitting: General Surgery

## 2022-05-20 DIAGNOSIS — N6320 Unspecified lump in the left breast, unspecified quadrant: Secondary | ICD-10-CM

## 2022-05-20 DIAGNOSIS — N61 Mastitis without abscess: Secondary | ICD-10-CM

## 2022-05-25 ENCOUNTER — Inpatient Hospital Stay
Admission: RE | Admit: 2022-05-25 | Discharge: 2022-05-25 | Disposition: A | Discharge status: Home / Self Care | Payer: 59 | Source: Ambulatory Visit

## 2022-05-25 NOTE — Patient Instructions (Signed)
Your procedure is scheduled on: 06/02/22 - Thursday Report to the Registration Desk on the 1st floor of the [facility]. To find out your arrival time, please call [phone] between 1PM - 3PM on: 06/01/22 - Wednesday If your arrival time is 6:00 am, do not arrive prior to that time as the [place] entrance doors do not open until 6:00 am.  REMEMBER: Instructions that are not followed completely may result in serious medical risk, up to and including death; or upon the discretion of your surgeon and anesthesiologist your surgery may need to be rescheduled.  Do not eat food or drink any liquids after midnight the night before surgery.  No gum chewing, lozengers or hard candies.   TAKE THESE MEDICATIONS THE MORNING OF SURGERY WITH A SIP OF WATER: - amLODipine (NORVASC)   One week prior to surgery: Stop meloxicam (MOBIC) and any Anti-inflammatories (NSAIDS) such as Advil, Aleve, Ibuprofen, Motrin, Naproxen, Naprosyn and Aspirin based products such as Excedrin, Goodys Powder, BC Powder.  Stop ANY OVER THE COUNTER supplements until after surgery.  You may however, continue to take Tylenol if needed for pain up until the day of surgery.  No Alcohol for 24 hours before or after surgery.  No Smoking including e-cigarettes for 24 hours prior to surgery.  No chewable tobacco products for at least 6 hours prior to surgery.  No nicotine patches on the day of surgery.  Do not use any "recreational" drugs for at least a week prior to your surgery.  Please be advised that the combination of cocaine and anesthesia may have negative outcomes, up to and including death. If you test positive for cocaine, your surgery will be cancelled.  On the morning of surgery brush your teeth with toothpaste and water, you may rinse your mouth with mouthwash if you wish. Do not swallow any toothpaste or mouthwash.  Use CHG Soap or wipes as directed on instruction sheet.  Do not wear jewelry, make-up,  hairpins, clips or nail polish.  Do not wear lotions, powders, or perfumes.   Do not shave body from the neck down 48 hours prior to surgery just in case you cut yourself which could leave a site for infection.  Also, freshly shaved skin may become irritated if using the CHG soap.  Contact lenses, hearing aids and dentures may not be worn into surgery.  Do not bring valuables to the hospital. [facility] is not responsible for any missing/lost belongings or valuables.   Notify your doctor if there is any change in your medical condition (cold, fever, infection).  Wear comfortable clothing (specific to your surgery type) to the hospital.  After surgery, you can help prevent lung complications by doing breathing exercises.  Take deep breaths and cough every 1-2 hours. Your doctor may order a device called an Incentive Spirometer to help you take deep breaths. When coughing or sneezing, hold a pillow firmly against your incision with both hands. This is called "splinting." Doing this helps protect your incision. It also decreases belly discomfort.  If you are being admitted to the hospital overnight, leave your suitcase in the car. After surgery it may be brought to your room.  If you are being discharged the day of surgery, you will not be allowed to drive home. You will need a responsible adult (18 years or older) to drive you home and stay with you that night.   If you are taking public transportation, you will need to have a responsible adult (  18 years or older) with you. Please confirm with your physician that it is acceptable to use public transportation.   Please call the [place] Dept. at [phone] if you have any questions about these instructions.  Surgery Visitation Policy:  Patients undergoing a surgery or procedure may have two family members or support persons with them as long as the person is not COVID-19 positive or experiencing its symptoms.    Inpatient Visitation:    Visiting hours are 7 a.m. to 8 p.m. Up to four visitors are allowed at one time in a patient room. The visitors may rotate out with other people during the day. One designated support person (adult) may remain overnight.  Due to an increase in RSV and influenza rates and associated hospitalizations, children ages 71 and under will not be able to visit patients in [facility]. Masks continue to be strongly recommended.

## 2022-05-25 NOTE — Pre-Procedure Instructions (Signed)
Message lefted for patient to return call for her PAT appointment that is scheduled for today .

## 2022-05-26 ENCOUNTER — Encounter: Payer: Self-pay | Admitting: Oncology

## 2022-05-26 ENCOUNTER — Other Ambulatory Visit: Payer: 59

## 2022-05-26 NOTE — OR Nursing (Signed)
Called patient to complete her PAT appointment as scheduled, she voices to me that she will need to cancel her surgery scheduled on 06/02/22 with Dr. Earnestine Leys until after the new Year, I informed her to reach out to Dr. Sabra Heck asap and I sent a secure chat message informing him of her wishes.

## 2022-05-28 ENCOUNTER — Encounter: Payer: Self-pay | Admitting: Oncology

## 2022-05-30 ENCOUNTER — Inpatient Hospital Stay: Admission: RE | Admit: 2022-05-30 | Payer: 59 | Source: Ambulatory Visit

## 2022-06-02 ENCOUNTER — Ambulatory Visit: Admission: RE | Admit: 2022-06-02 | Payer: 59 | Source: Home / Self Care | Admitting: Specialist

## 2022-06-02 ENCOUNTER — Encounter: Admission: RE | Payer: Self-pay | Source: Home / Self Care

## 2022-06-02 SURGERY — CARPAL TUNNEL RELEASE
Anesthesia: General | Laterality: Right

## 2022-06-22 ENCOUNTER — Ambulatory Visit: Payer: 59 | Admitting: Obstetrics

## 2022-06-23 ENCOUNTER — Other Ambulatory Visit: Payer: 59

## 2022-07-08 ENCOUNTER — Ambulatory Visit: Payer: 59 | Admitting: Obstetrics

## 2022-07-20 ENCOUNTER — Encounter: Payer: Self-pay | Admitting: Emergency Medicine

## 2022-07-20 ENCOUNTER — Other Ambulatory Visit: Payer: Self-pay

## 2022-07-20 ENCOUNTER — Emergency Department
Admission: EM | Admit: 2022-07-20 | Discharge: 2022-07-20 | Disposition: A | Discharge status: Home / Self Care | Payer: 59 | Attending: Emergency Medicine | Admitting: Emergency Medicine

## 2022-07-20 ENCOUNTER — Encounter: Payer: Self-pay | Admitting: Oncology

## 2022-07-20 DIAGNOSIS — B9789 Other viral agents as the cause of diseases classified elsewhere: Secondary | ICD-10-CM | POA: Insufficient documentation

## 2022-07-20 DIAGNOSIS — I10 Essential (primary) hypertension: Secondary | ICD-10-CM

## 2022-07-20 DIAGNOSIS — J069 Acute upper respiratory infection, unspecified: Secondary | ICD-10-CM | POA: Insufficient documentation

## 2022-07-20 DIAGNOSIS — Z20822 Contact with and (suspected) exposure to covid-19: Secondary | ICD-10-CM | POA: Diagnosis not present

## 2022-07-20 DIAGNOSIS — J029 Acute pharyngitis, unspecified: Secondary | ICD-10-CM | POA: Diagnosis present

## 2022-07-20 LAB — GROUP A STREP BY PCR: Group A Strep by PCR: NOT DETECTED

## 2022-07-20 LAB — RESP PANEL BY RT-PCR (RSV, FLU A&B, COVID)  RVPGX2
Influenza A by PCR: NEGATIVE
Influenza B by PCR: NEGATIVE
Resp Syncytial Virus by PCR: NEGATIVE
SARS Coronavirus 2 by RT PCR: NEGATIVE

## 2022-07-20 MED ORDER — CHLORTHALIDONE 25 MG PO TABS
25.0000 mg | ORAL_TABLET | Freq: Every day | ORAL | 2 refills | Status: DC
  Filled 2022-07-20: qty 24, 24d supply, fill #0
  Filled 2022-07-20: qty 6, 6d supply, fill #0
  Filled 2022-08-29: qty 30, 30d supply, fill #1
  Filled 2022-10-03 – 2022-12-26 (×2): qty 30, 30d supply, fill #2

## 2022-07-20 MED ORDER — AMLODIPINE BESYLATE 5 MG PO TABS
5.0000 mg | ORAL_TABLET | Freq: Every day | ORAL | 2 refills | Status: DC
  Filled 2022-07-20: qty 30, 30d supply, fill #0
  Filled 2022-08-29: qty 30, 30d supply, fill #1
  Filled 2022-10-03: qty 30, 30d supply, fill #2

## 2022-07-20 MED ORDER — BENZONATATE 100 MG PO CAPS
100.0000 mg | ORAL_CAPSULE | Freq: Three times a day (TID) | ORAL | 0 refills | Status: DC | PRN
  Filled 2022-07-20: qty 30, 5d supply, fill #0
  Filled 2022-07-20: qty 30, fill #0

## 2022-07-20 NOTE — ED Triage Notes (Signed)
Patient to ED via POV for body aches, chills, and a sore throat. Symptoms started yesterday. NAD noted.

## 2022-07-20 NOTE — ED Provider Notes (Signed)
[facility] Emergency Department Provider Note     Event Date/Time   First MD Initiated Contact with Patient 07/20/22 1217     (approximate)   History   Chills   HPI  Margaret Newman is a 42 y.o. female with a history of HTN, prediabetes, and iron deficiency anemia, presents to the ED for evaluation of bodyaches, chills and a sore throat.  Patient notes onset of symptoms yesterday.  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  She also reports she has been out of her blood pressure medicine for the last several months that she is in between primary providers.  Denies any chest pain or shortness of breath.   Physical Exam   Triage Vital Signs: ED Triage Vitals  Enc Vitals Group     BP 07/20/22 1144 (!) 174/112     Pulse Rate 07/20/22 1144 67     Resp 07/20/22 1144 18     Temp 07/20/22 1144 98.2 F (36.8 C)     Temp Source 07/20/22 1144 Oral     SpO2 07/20/22 1144 99 %     Weight --      Height --      Head Circumference --      Peak Flow --      Pain Score 07/20/22 1143 5     Pain Loc --      Pain Edu? --      Excl. in [place]? --     Most recent vital signs: Vitals:   07/20/22 1144  BP: (!) 174/112  Pulse: 67  Resp: 18  Temp: 98.2 F (36.8 C)  SpO2: 99%    General Awake, no distress. NAD HEENT NCAT. PERRL. EOMI. No rhinorrhea. Mucous membranes are moist.  CV:  Good peripheral perfusion.  RESP:  Normal effort.  ABD:  No distention.    ED Results / Procedures / Treatments   Labs (all labs ordered are listed, but only abnormal results are displayed) Labs Reviewed  RESP PANEL BY RT-PCR (RSV, FLU A&B, COVID)  [id number]  GROUP A STREP BY PCR    EKG   RADIOLOGY   No results found.   PROCEDURES:  Critical Care performed: No  Procedures   MEDICATIONS ORDERED IN ED: Medications - No data to display   IMPRESSION / MDM / [place] / ED COURSE  I reviewed the triage vital signs and the nursing  notes.                              Differential diagnosis includes, but is not limited to, COVID, flu, RSV, viral URI, AOM, strep pharyngitis  Patient's presentation is most consistent with acute complicated illness / injury requiring diagnostic workup.  Patient's diagnosis is consistent with viral URI, despite a negative viral panel screen and strep test. Patient will be discharged home with prescriptions for [facility], along with her request for her amlodipine and her chlorthalidone.  Patient is also advised to take OTC Delsym for additional cough relief.  Patient is to follow up with a selected primary care provider or community clinic, as needed or otherwise directed. Patient is given ED precautions to return to the ED for any worsening or new symptoms.     FINAL CLINICAL IMPRESSION(S) / ED DIAGNOSES   Final diagnoses:  Viral upper respiratory tract infection     Rx / DC Orders   ED Discharge  Orders          Ordered    amLODipine (NORVASC) 5 MG tablet  Daily        07/20/22 1257    chlorthalidone (HYGROTON) 25 MG tablet  Daily        07/20/22 1257    benzonatate (TESSALON PERLES) 100 MG capsule  3 times daily PRN        07/20/22 1257             Note:  This document was prepared using Dragon voice recognition software and may include unintentional dictation errors.    Melvenia Needles, PA-C 07/20/22 [place]    Lavonia Drafts, MD 07/21/22 1226

## 2022-07-20 NOTE — Discharge Instructions (Addendum)
Take OTC Delsym for cough control. Select a new PCP for hypertension management.  Please go to the following website to schedule new (and existing) patient appointments:   [email or web address]   The following is a list of primary care offices in the area who are accepting new patients at this time.  Please reach out to one of them directly and let them know you would like to schedule an appointment to follow up on an Emergency Department visit, and/or to establish a new primary care provider (PCP).  There are likely other primary care clinics in the are who are accepting new patients, but this is an excellent place to start:  [place] physician: Dr Lavon Paganini [address] [place], [place] [address] [phone]  [facility] Lead Physician: Dr Steele Sizer [address], [place], [place] [address] [phone]  [place] Physician: Dr Park Liter [address] [place], [place] [address] [phone]  [facility] Lead Physician: Dr Dewaine Oats [address] [place], [place] [address] [phone]  [facility] [place] Physician: Dr Halina Maidens [address] [place], [place],  [address] [phone]

## 2022-08-29 ENCOUNTER — Other Ambulatory Visit: Payer: Self-pay

## 2022-09-06 ENCOUNTER — Emergency Department
Admission: EM | Admit: 2022-09-06 | Discharge: 2022-09-06 | Disposition: A | Discharge status: Home / Self Care | Payer: 59 | Attending: Emergency Medicine | Admitting: Emergency Medicine

## 2022-09-06 ENCOUNTER — Encounter: Payer: Self-pay | Admitting: Emergency Medicine

## 2022-09-06 DIAGNOSIS — R197 Diarrhea, unspecified: Secondary | ICD-10-CM | POA: Insufficient documentation

## 2022-09-06 DIAGNOSIS — Z1152 Encounter for screening for COVID-19: Secondary | ICD-10-CM | POA: Diagnosis not present

## 2022-09-06 DIAGNOSIS — R112 Nausea with vomiting, unspecified: Secondary | ICD-10-CM | POA: Diagnosis not present

## 2022-09-06 LAB — SARS CORONAVIRUS 2 BY RT PCR: SARS Coronavirus 2 by RT PCR: NEGATIVE

## 2022-09-06 MED ORDER — ONDANSETRON 4 MG PO TBDP: 4.0000 mg | ORAL_TABLET | Freq: Three times a day (TID) | ORAL | 0 refills | Status: DC | PRN

## 2022-09-06 MED ORDER — ONDANSETRON 4 MG PO TBDP
4.0000 mg | ORAL_TABLET | Freq: Once | ORAL | Status: AC
  Administered 2022-09-06: 4 mg via ORAL
  Filled 2022-09-06: qty 1

## 2022-09-06 NOTE — Discharge Instructions (Signed)
You are seen in the emergency department for nausea, vomiting and diarrhea.  Concerned that you could have a viral illness.  You were given a prescription for nausea medication.  It is importantly stay hydrated and drink plenty of fluids especially with electrolytes like Gatorade or Pedialyte.  Return to the emergency department with any signs of dehydration or worsening abdominal pain/vomiting.  zofran (ondansetron) - nausea medication, take 1 tablet every 8 hours as needed for nausea/vomiting.

## 2022-09-06 NOTE — ED Provider Notes (Signed)
[facility] Provider Note    Event Date/Time   First MD Initiated Contact with Patient 09/06/22 2021     (approximate)   History   Emesis   HPI  Margaret Newman is a 42 y.o. female presents to the emergency department with not feeling well.  Patient endorses 2 days of nausea, vomiting and diarrhea.  States that she had some improvement of her symptoms today but she still did not feel well enough to go to work.  Denies any abdominal pain at this time.  Normal urine output.  Denies any dysuria, urinary urgency or frequency.  Does endorse marijuana use.  Denies any alcohol use.     Physical Exam   Triage Vital Signs: ED Triage Vitals  Enc Vitals Group     BP 09/06/22 2018 134/87     Pulse Rate 09/06/22 2018 84     Resp 09/06/22 2018 18     Temp 09/06/22 2018 98 F (36.7 C)     Temp src --      SpO2 09/06/22 2018 100 %     Weight 09/06/22 2015 200 lb (90.7 kg)     Height 09/06/22 2015 5\' 3"  (1.6 m)     Head Circumference --      Peak Flow --      Pain Score 09/06/22 2015 0     Pain Loc --      Pain Edu? --      Excl. in [place]? --     Most recent vital signs: Vitals:   09/06/22 2018  BP: 134/87  Pulse: 84  Resp: 18  Temp: 98 F (36.7 C)  SpO2: 100%    Physical Exam Constitutional:      Appearance: She is well-developed.  HENT:     Head: Atraumatic.  Eyes:     Conjunctiva/sclera: Conjunctivae normal.  Cardiovascular:     Rate and Rhythm: Regular rhythm.  Pulmonary:     Effort: No respiratory distress.  Abdominal:     General: There is no distension.     Tenderness: There is no abdominal tenderness. There is no right CVA tenderness or left CVA tenderness.  Musculoskeletal:        General: Normal range of motion.     Cervical back: Normal range of motion.  Skin:    General: Skin is warm.     Capillary Refill: Capillary refill takes less than 2 seconds.  Neurological:     Mental Status: She is alert. Mental status is at  baseline.  Psychiatric:        Mood and Affect: Mood normal.     IMPRESSION / MDM / [place] / ED COURSE  I reviewed the triage vital signs and the nursing notes.  Differential diagnosis including viral illness included COVID, viral gastroenteritis, electrolyte abnormality, dehydration, foodborne illness.  Low suspicion for ACS, no chest pain.  No abdominal tenderness palpation have a low suspicion for acute appendicitis, diverticulitis or other acute intra-abdominal pathology.  No CVA tenderness or symptoms of a urinary tract infection concerning for pyelonephritis.  No recent antibiotic use have a low suspicion for C. difficile.  No tachycardic or bradycardic dysrhythmias while on cardiac telemetry.    LABS (all labs ordered are listed, but only abnormal results are displayed) Labs interpreted as -    Labs Reviewed  SARS CORONAVIRUS 2 BY RT PCR     MDM  Patient was given ODT Zofran.  Able to tolerate p.o. in  the emergency department.  Does not appear clinically dehydrated.  Do not feel that further lab work is necessary at this time.  Discussed good hydration, will give prescription for Zofran and given return precautions for any worsening symptoms.     PROCEDURES:  Critical Care performed: No  Procedures  Patient's presentation is most consistent with acute illness / injury with system symptoms.   MEDICATIONS ORDERED IN ED: Medications  ondansetron (ZOFRAN-ODT) disintegrating tablet 4 mg (has no administration in time range)    FINAL CLINICAL IMPRESSION(S) / ED DIAGNOSES   Final diagnoses:  Nausea vomiting and diarrhea     Rx / DC Orders   ED Discharge Orders          Ordered    ondansetron (ZOFRAN-ODT) 4 MG disintegrating tablet  Every 8 hours PRN        09/06/22 2029             Note:  This document was prepared using Dragon voice recognition software and may include unintentional dictation errors.   Nathaniel Man, MD 09/06/22  2035

## 2022-09-06 NOTE — ED Triage Notes (Signed)
Pt presents ambulatory to triage via POV with complaints of N/V/D that started 2 days ago. Pt notes some coworkers have had a GI bug. Pt needing clearance to go back to work. Endorses some body aches and fatigue. A&Ox4 at this time. Denies AP, CP or SOB.

## 2022-10-01 ENCOUNTER — Other Ambulatory Visit: Payer: Self-pay

## 2022-10-01 DIAGNOSIS — R1031 Right lower quadrant pain: Secondary | ICD-10-CM | POA: Diagnosis present

## 2022-10-01 DIAGNOSIS — M545 Low back pain, unspecified: Secondary | ICD-10-CM | POA: Diagnosis not present

## 2022-10-01 DIAGNOSIS — R1032 Left lower quadrant pain: Secondary | ICD-10-CM | POA: Insufficient documentation

## 2022-10-01 DIAGNOSIS — R1110 Vomiting, unspecified: Secondary | ICD-10-CM | POA: Insufficient documentation

## 2022-10-01 DIAGNOSIS — Z5321 Procedure and treatment not carried out due to patient leaving prior to being seen by health care provider: Secondary | ICD-10-CM | POA: Diagnosis not present

## 2022-10-01 LAB — CBC
HCT: 33.3 % — ABNORMAL LOW (ref 36.0–46.0)
Hemoglobin: 10.4 g/dL — ABNORMAL LOW (ref 12.0–15.0)
MCH: 24.8 pg — ABNORMAL LOW (ref 26.0–34.0)
MCHC: 31.2 g/dL (ref 30.0–36.0)
MCV: 79.5 fL — ABNORMAL LOW (ref 80.0–100.0)
Platelets: 224 10*3/uL (ref 150–400)
RBC: 4.19 MIL/uL (ref 3.87–5.11)
RDW: 19.2 % — ABNORMAL HIGH (ref 11.5–15.5)
WBC: 13.1 10*3/uL — ABNORMAL HIGH (ref 4.0–10.5)
nRBC: 0 % (ref 0.0–0.2)

## 2022-10-01 LAB — COMPREHENSIVE METABOLIC PANEL
ALT: 11 U/L (ref 0–44)
AST: 13 U/L — ABNORMAL LOW (ref 15–41)
Albumin: 3.7 g/dL (ref 3.5–5.0)
Alkaline Phosphatase: 81 U/L (ref 38–126)
Anion gap: 7 (ref 5–15)
BUN: 11 mg/dL (ref 6–20)
CO2: 23 mmol/L (ref 22–32)
Calcium: 8.8 mg/dL — ABNORMAL LOW (ref 8.9–10.3)
Chloride: 101 mmol/L (ref 98–111)
Creatinine, Ser: 0.63 mg/dL (ref 0.44–1.00)
GFR, Estimated: >60 mL/min (ref >60)
Glucose, Bld: 105 mg/dL — ABNORMAL HIGH (ref 70–99)
Potassium: 3.1 mmol/L — ABNORMAL LOW (ref 3.5–5.1)
Sodium: 131 mmol/L — ABNORMAL LOW (ref 135–145)
Total Bilirubin: 0.9 mg/dL (ref 0.3–1.2)
Total Protein: 7.4 g/dL (ref 6.5–8.1)

## 2022-10-01 LAB — LIPASE, BLOOD: Lipase: 28 U/L (ref 11–51)

## 2022-10-01 NOTE — ED Notes (Signed)
Patient requested we take her IV out because it was irritating and she was still sitting in the lobby. She is aware and is fine with being stuck again once she is in a room.

## 2022-10-01 NOTE — ED Triage Notes (Signed)
Pt reports bilateral lower abdominal pain beginning last night and going away after taking motrin. Pt reports pain came back while at work today. States she had a small BM during the day with no improvement in pain. Also reports mild lower back pain bilaterally. Pt reports 1 episode of vomiting an hour ago and believes it due to the medication and not eating. Denies any nausea at this time. Denies fever, SOB, CP.

## 2022-10-02 ENCOUNTER — Emergency Department
Admission: EM | Admit: 2022-10-02 | Discharge: 2022-10-02 | Discharge status: Against Medical Advice | Payer: 59 | Attending: Emergency Medicine | Admitting: Emergency Medicine

## 2022-10-02 ENCOUNTER — Telehealth: Payer: 59 | Admitting: Family Medicine

## 2022-10-02 DIAGNOSIS — N760 Acute vaginitis: Secondary | ICD-10-CM | POA: Diagnosis not present

## 2022-10-02 DIAGNOSIS — B3731 Acute candidiasis of vulva and vagina: Secondary | ICD-10-CM

## 2022-10-02 DIAGNOSIS — B9689 Other specified bacterial agents as the cause of diseases classified elsewhere: Secondary | ICD-10-CM | POA: Diagnosis not present

## 2022-10-02 MED ORDER — FLUCONAZOLE 150 MG PO TABS
150.0000 mg | ORAL_TABLET | Freq: Once | ORAL | 0 refills | Status: AC
  Filled 2022-10-03: qty 1, 1d supply, fill #0

## 2022-10-02 MED ORDER — METRONIDAZOLE 500 MG PO TABS
500.0000 mg | ORAL_TABLET | Freq: Two times a day (BID) | ORAL | 0 refills | Status: AC
  Filled 2022-10-03: qty 14, 7d supply, fill #0

## 2022-10-02 NOTE — Progress Notes (Signed)

## 2022-10-03 ENCOUNTER — Other Ambulatory Visit: Payer: Self-pay

## 2022-10-18 ENCOUNTER — Other Ambulatory Visit: Payer: Self-pay

## 2022-11-09 ENCOUNTER — Telehealth: Payer: Self-pay

## 2022-11-09 NOTE — Telephone Encounter (Signed)
Pt calling; pain p IC for the last three times; [facility] unable to see her until end of June; is there anyway she can be seen sooner?  [phone]  Pt states no abnormal d/c; has noticed a light fishy smell, pain does double her over. Takes a 600mg  Motrin and it subsides after about five hours.  Adv to sched appt to do a self swab and if pain gets that bad again to go to ER; keep appt on 6/24.  Pt voices understanding.

## 2022-11-23 NOTE — Telephone Encounter (Signed)
Appt scheduled for later this month.

## 2022-12-05 ENCOUNTER — Ambulatory Visit: Payer: 59 | Admitting: Obstetrics

## 2022-12-05 ENCOUNTER — Telehealth: Payer: Self-pay | Admitting: Obstetrics

## 2022-12-05 NOTE — Telephone Encounter (Signed)
I contacted the patient via phone X2. Call couldn't be completed as dialed. Unable to reach patient via phone due to [facility] out of office. We need to reschedule.

## 2022-12-06 NOTE — Telephone Encounter (Signed)
The patient called back and rescheduled for Margaret Newman. She is now scheduled for 7/8 with [facility].

## 2022-12-19 ENCOUNTER — Ambulatory Visit: Payer: 59 | Admitting: Obstetrics

## 2022-12-26 ENCOUNTER — Other Ambulatory Visit: Payer: Self-pay

## 2022-12-26 ENCOUNTER — Other Ambulatory Visit: Payer: Self-pay | Admitting: Physician Assistant

## 2022-12-27 ENCOUNTER — Other Ambulatory Visit: Payer: Self-pay

## 2023-01-12 ENCOUNTER — Other Ambulatory Visit: Payer: Self-pay

## 2023-01-16 ENCOUNTER — Telehealth: Payer: Self-pay

## 2023-01-16 NOTE — Telephone Encounter (Signed)
Margaret Newman called triage stating she and Margaret Newman keep missing each other as in appointment wise. Margaret Newman is bleeding a lot states she's changing a pad 2-3 hours it's the overnight big pads, passing blood clots, feeling very tired. Patient is wondering if she needs to go to the ER or try to be seen in office.

## 2023-01-19 ENCOUNTER — Emergency Department: Payer: 59

## 2023-01-19 ENCOUNTER — Other Ambulatory Visit: Payer: Self-pay

## 2023-01-19 ENCOUNTER — Emergency Department
Admission: EM | Admit: 2023-01-19 | Discharge: 2023-01-19 | Disposition: A | Discharge status: Home / Self Care | Payer: 59 | Attending: Student in an Organized Health Care Education/Training Program | Admitting: Student in an Organized Health Care Education/Training Program

## 2023-01-19 DIAGNOSIS — N939 Abnormal uterine and vaginal bleeding, unspecified: Secondary | ICD-10-CM | POA: Diagnosis not present

## 2023-01-19 DIAGNOSIS — N83201 Unspecified ovarian cyst, right side: Secondary | ICD-10-CM | POA: Insufficient documentation

## 2023-01-19 LAB — CBC
HCT: 28.9 % — ABNORMAL LOW (ref 36.0–46.0)
Hemoglobin: 8.7 g/dL — ABNORMAL LOW (ref 12.0–15.0)
MCH: 23.6 pg — ABNORMAL LOW (ref 26.0–34.0)
MCHC: 30.1 g/dL (ref 30.0–36.0)
MCV: 78.5 fL — ABNORMAL LOW (ref 80.0–100.0)
Platelets: 302 10*3/uL (ref 150–400)
RBC: 3.68 MIL/uL — ABNORMAL LOW (ref 3.87–5.11)
RDW: 17.9 % — ABNORMAL HIGH (ref 11.5–15.5)
WBC: 5 10*3/uL (ref 4.0–10.5)
nRBC: 0 % (ref 0.0–0.2)

## 2023-01-19 LAB — BASIC METABOLIC PANEL
Anion gap: 8 (ref 5–15)
BUN: 9 mg/dL (ref 6–20)
CO2: 23 mmol/L (ref 22–32)
Calcium: 8.7 mg/dL — ABNORMAL LOW (ref 8.9–10.3)
Chloride: 107 mmol/L (ref 98–111)
Creatinine, Ser: 0.65 mg/dL (ref 0.44–1.00)
GFR, Estimated: >60 mL/min (ref >60)
Glucose, Bld: 99 mg/dL (ref 70–99)
Potassium: 3.7 mmol/L (ref 3.5–5.1)
Sodium: 138 mmol/L (ref 135–145)

## 2023-01-19 LAB — HCG, QUANTITATIVE, PREGNANCY: hCG, Beta Chain, Quant, S: <1 m[IU]/mL (ref <5)

## 2023-01-19 LAB — POC URINE PREG, ED: Preg Test, Ur: NEGATIVE

## 2023-01-19 MED ORDER — TRANEXAMIC ACID 650 MG PO TABS: 1300.0000 mg | ORAL_TABLET | Freq: Three times a day (TID) | ORAL | 0 refills | Status: AC

## 2023-01-19 NOTE — ED Triage Notes (Signed)
Pt to ED for vaginal bleeding x2 weeks. Reports unable to get OB appt. States goes through 12 pads in 24 hours

## 2023-01-19 NOTE — ED Provider Notes (Signed)
[facility] Provider Note    Event Date/Time   First MD Initiated Contact with Patient 01/19/23 1557     (approximate)   History   Vaginal Bleeding   HPI  Laylie Vanvalkenburgh is a 42 y.o. female presents the ER for evaluation of 3 months of heavy irregular vaginal bleeding.  Feels like she is passing clots quite frequently.  Over the past few days started feeling weak and tired.  No fevers.  Denies any significant pain or discomfort.     Physical Exam   Triage Vital Signs: ED Triage Vitals  Encounter Vitals Group     BP 01/19/23 1452 (!) 160/106     Systolic BP Percentile --      Diastolic BP Percentile --      Pulse Rate 01/19/23 1452 79     Resp 01/19/23 1452 18     Temp 01/19/23 1452 98.6 F (37 C)     Temp Source 01/19/23 1727 Oral     SpO2 01/19/23 1452 100 %     Weight 01/19/23 1451 195 lb (88.5 kg)     Height 01/19/23 1451 5\' 3"  (1.6 m)     Head Circumference --      Peak Flow --      Pain Score 01/19/23 1450 4     Pain Loc --      Pain Education --      Exclude from Growth Chart --     Most recent vital signs: Vitals:   01/19/23 1452 01/19/23 1727  BP: (!) 160/106 (!) 172/95  Pulse: 79 80  Resp: 18 19  Temp: 98.6 F (37 C) 97.7 F (36.5 C)  SpO2: 100% 95%     Constitutional: Alert  Eyes: Conjunctivae are normal.  Head: Atraumatic. Nose: No congestion/rhinnorhea. Mouth/Throat: Mucous membranes are moist.   Neck: Painless ROM.  Cardiovascular:   Good peripheral circulation. Respiratory: Normal respiratory effort.  No retractions.  Gastrointestinal: Soft and nontender.  Musculoskeletal:  no deformity Neurologic:  MAE spontaneously. No gross focal neurologic deficits are appreciated.  Skin:  Skin is warm, dry and intact. No rash noted. Psychiatric: Mood and affect are normal. Speech and behavior are normal.    ED Results / Procedures / Treatments   Labs (all labs ordered are listed, but only abnormal results are  displayed) Labs Reviewed  CBC - Abnormal; Notable for the following components:      Result Value   RBC 3.68 (*)    Hemoglobin 8.7 (*)    HCT 28.9 (*)    MCV 78.5 (*)    MCH 23.6 (*)    RDW 17.9 (*)    All other components within normal limits  BASIC METABOLIC PANEL - Abnormal; Notable for the following components:   Calcium 8.7 (*)    All other components within normal limits  HCG, QUANTITATIVE, PREGNANCY  POC URINE PREG, ED     EKG     RADIOLOGY Please see ED Course for my review and interpretation.  I personally reviewed all radiographic images ordered to evaluate for the above acute complaints and reviewed radiology reports and findings.  These findings were personally discussed with the patient.  Please see medical record for radiology report.    PROCEDURES:  Critical Care performed: No  Procedures   MEDICATIONS ORDERED IN ED: Medications - No data to display   IMPRESSION / MDM / ASSESSMENT AND PLAN / ED COURSE  I reviewed the triage vital signs and the  nursing notes.                              Differential diagnosis includes, but is not limited to, AB, DU B, mass, bleeding dyscrasia, symptomatic anemia  Patient presenting to the ER for evaluation of symptoms as described above.  Based on symptoms, risk factors and considered above differential, this presenting complaint could reflect a potentially life-threatening illness therefore the patient will be placed on continuous pulse oximetry and telemetry for monitoring.  Laboratory evaluation will be sent to evaluate for the above complaints.  Blood work does show evidence of mild acute on chronic anemia.  Likely iron deficiency as she has had this in the past worsened by heavy vaginal bleeding.  She is hemodynamically stable.  Ultrasound ordered for the but differential.  We discussed findings on the ultrasound.  As she does have a history of smoking we discussed the risk of DVT formation.  Will place on Lysteda  for brief period to help with bleeding and patient is to have follow-up with OB/GYN.  Discussed signs and symptoms for which she should return to the ER.       FINAL CLINICAL IMPRESSION(S) / ED DIAGNOSES   Final diagnoses:  Vaginal bleeding  Cyst of right ovary     Rx / DC Orders   ED Discharge Orders          Ordered    tranexamic acid (LYSTEDA) 650 MG TABS tablet  3 times daily        01/19/23 1840             Note:  This document was prepared using Dragon voice recognition software and may include unintentional dictation errors.    Willy Eddy, MD 01/19/23 [phone]

## 2023-02-01 ENCOUNTER — Ambulatory Visit (INDEPENDENT_AMBULATORY_CARE_PROVIDER_SITE_OTHER): Payer: 59 | Admitting: Obstetrics

## 2023-02-01 ENCOUNTER — Encounter: Payer: Self-pay | Admitting: Obstetrics

## 2023-02-01 ENCOUNTER — Other Ambulatory Visit (HOSPITAL_COMMUNITY)
Admission: RE | Admit: 2023-02-01 | Discharge: 2023-02-01 | Disposition: A | Discharge status: Home / Self Care | Payer: 59 | Source: Ambulatory Visit | Attending: Obstetrics | Admitting: Obstetrics

## 2023-02-01 ENCOUNTER — Other Ambulatory Visit: Payer: Self-pay

## 2023-02-01 VITALS — BP 156/88 | Ht 62.0 in | Wt 195.0 lb

## 2023-02-01 DIAGNOSIS — Z113 Encounter for screening for infections with a predominantly sexual mode of transmission: Secondary | ICD-10-CM

## 2023-02-01 DIAGNOSIS — Z01419 Encounter for gynecological examination (general) (routine) without abnormal findings: Secondary | ICD-10-CM

## 2023-02-01 DIAGNOSIS — Z124 Encounter for screening for malignant neoplasm of cervix: Secondary | ICD-10-CM

## 2023-02-01 DIAGNOSIS — Z1231 Encounter for screening mammogram for malignant neoplasm of breast: Secondary | ICD-10-CM

## 2023-02-01 DIAGNOSIS — Z30430 Encounter for insertion of intrauterine contraceptive device: Secondary | ICD-10-CM

## 2023-02-01 MED ORDER — MISOPROSTOL 200 MCG PO TABS
200.0000 ug | ORAL_TABLET | Freq: Once | ORAL | 0 refills | Status: DC
Start: 2023-02-01 — End: 2023-03-07
  Filled 2023-02-01: qty 1, 1d supply, fill #0

## 2023-02-01 NOTE — Patient Instructions (Signed)
Anemia  Anemia is a condition in which there are not enough red blood cells or hemoglobin in the blood. Hemoglobin is a substance in red blood cells that carries oxygen. When you do not have enough red blood cells or hemoglobin (are anemic), your body cannot get enough oxygen, and your organs may not work properly. As a result, you may feel very tired or have other problems. What are the causes? Common causes of anemia include: Excessive bleeding. Anemia can be caused by excessive bleeding inside or outside the body, including bleeding from the intestines or from heavy menstrual periods in females. Poor nutrition. Long-lasting (chronic) kidney, thyroid, and liver disease. Bone marrow disorders, spleen problems, and blood disorders. Cancer and treatments for cancer. Human immunodeficiency virus (HIV) and acquired immunodeficiency syndrome (AIDS). Infections, medicines, and autoimmune disorders that destroy red blood cells. What are the signs or symptoms? Symptoms of this condition include: Minor weakness. Dizziness. Headache, or difficulties concentrating and sleeping. Heartbeats that feel irregular or faster than normal (palpitations). Shortness of breath, especially with exercise. Pale skin, lips, and nails, or cold hands and feet. Upset stomach (indigestion) and nausea. Symptoms may occur suddenly or develop slowly. If your anemia is mild, you may not have symptoms. How is this diagnosed? This condition is diagnosed based on blood tests, your medical history, and a physical exam. In some cases, a test may be needed in which cells are removed from the soft tissue inside of a bone and looked at under a microscope (bone marrow biopsy). Your health care provider may also check your stool (feces) for blood and may do more testing to look for the cause of your bleeding. Other tests may include: Imaging tests, such as a CT scan or MRI. A procedure to see inside your esophagus and stomach  (endoscopy). The esophagus is the part of the body that moves food from your mouth to your stomach. A procedure to see inside your colon and rectum (colonoscopy). How is this treated? Treatment for this condition depends on the cause. If you continue to lose a lot of blood, you may need to be treated at a hospital. Treatment may include: Taking supplements of iron, vitamin B12, or folic acid. Taking a hormone medicine (erythropoietin) that can help to stimulate red blood cell growth. Receiving donated blood through an IV (blood transfusion). This may be needed if you lose a lot of blood. Making changes to your diet. Having surgery to remove your spleen. Follow these instructions at home: Take over-the-counter and prescription medicines only as told by your health care provider. Take supplements only as told by your health care provider. Follow any diet instructions that you were given by your health care provider. Keep all follow-up visits. Your health care provider will want to recheck your blood tests. Contact a health care provider if: You develop new bleeding anywhere in the body. You are very weak. Get help right away if: You are short of breath. You have pain in your abdomen or chest. You are dizzy or feel faint. You have trouble concentrating. You have bloody stools, black stools, or tarry stools. You vomit repeatedly or you vomit up blood. These symptoms may be an emergency. Get help right away. Call 911. Do not wait to see if the symptoms will go away. Do not drive yourself to the hospital. Summary Anemia is a condition in which you do not have enough red blood cells or enough of a substance in your red blood cells that carries oxygen.  Symptoms may occur suddenly or develop slowly. If your anemia is mild, you may not have symptoms. This condition is diagnosed with blood tests, a medical history, and a physical exam. Other tests may be needed. Treatment for this condition  depends on the cause of the anemia. This information is not intended to replace advice given to you by your health care provider. Make sure you discuss any questions you have with your health care provider. Document Revised: 08/23/2021 Document Reviewed: 08/23/2021 Elsevier Patient Education  2024 Elsevier Inc. Levonorgestrel Intrauterine Device What is this medication? LEVONORGESTREL (LEE voe nor jes trel) prevents ovulation and pregnancy. It may also be used to treat heavy periods. It belongs to a group of medications called contraceptives. This medication is a progestin hormone. This medicine may be used for other purposes; ask your health care provider or pharmacist if you have questions. COMMON BRAND NAME(S): Cameron Ali What should I tell my care team before I take this medication? They need to know if you have any of these conditions: Abnormal Pap smear Cancer of the breast, uterus, or cervix Diabetes Endometritis Genital or pelvic infection now or in the past Have more than one sexual partner or your partner has more than one partner Heart disease History of an ectopic or tubal pregnancy Immune system problems IUD in place Liver disease or tumor Problems with blood clots or take blood-thinners Seizures Use intravenous drugs Uterus of unusual shape Vaginal bleeding that has not been explained An unusual or allergic reaction to levonorgestrel, other hormones, silicone, or polyethylene, medications, foods, dyes, or preservatives Pregnant or trying to get pregnant Breast-feeding How should I use this medication? This device is placed inside the uterus by your care team. A patient package insert for the product will be given each time it is inserted. Be sure to read this information carefully each time. The sheet may change often. Talk to your care team about use of this medication in children. Special care may be needed. Overdosage: If you think you have  taken too much of this medicine contact a poison control center or emergency room at once. NOTE: This medicine is only for you. Do not share this medicine with others. What if I miss a dose? This does not apply. Depending on the brand of device you have inserted, the device will need to be replaced every 3 to 8 years if you wish to continue using this type of birth control. What may interact with this medication? Interactions are not expected. Tell your care team about all the medications you take. This list may not describe all possible interactions. Give your health care provider a list of all the medicines, herbs, non-prescription drugs, or dietary supplements you use. Also tell them if you smoke, drink alcohol, or use illegal drugs. Some items may interact with your medicine. What should I watch for while using this medication? Visit your care team for regular check-ups. Tell your care team if you or your partner becomes HIV positive or gets a sexually transmitted disease. Using this medication does not protect you or your partner against HIV or other sexually transmitted infections (STIs). You can check the placement of the IUD yourself by reaching up to the top of your vagina with clean fingers to feel the threads. Do not pull on the threads. It is a good habit to check placement after each menstrual period. Call your care team right away if you feel more of the IUD than just the threads  or if you cannot feel the threads at all. The IUD may come out by itself. You may become pregnant if the device comes out. If you notice that the IUD has come out use a backup birth control method like condoms and call your care team. Using tampons will not change the position of the IUD and are okay to use during your period. This IUD can be safely scanned with magnetic resonance imaging (MRI) only under specific conditions. Before you have an MRI, tell your care team that you have an IUD in place, and which type  of IUD you have in place. What side effects may I notice from receiving this medication? Side effects that you should report to your care team as soon as possible: Allergic reactions--skin rash, itching, hives, swelling of the face, lips, tongue, or throat Blood clot--pain, swelling, or warmth in the leg, shortness of breath, chest pain Gallbladder problems--severe stomach pain, nausea, vomiting, fever Increase in blood pressure Liver injury--right upper belly pain, loss of appetite, nausea, light-colored stool, dark yellow or brown urine, yellowing skin or eyes, unusual weakness or fatigue New or worsening migraines or headaches Pelvic inflammatory disease (PID)--fever, abdominal pain, pelvic pain, pain or trouble passing urine, spotting, bleeding during or after sex Stroke--sudden numbness or weakness of the face, arm, or leg, trouble speaking, confusion, trouble walking, loss of balance or coordination, dizziness, severe headache, change in vision Unusual vaginal discharge, itching, or odor Vaginal pain, irritation, or sores Worsening mood, feelings of depression Side effects that usually do not require medical attention (report to your care team if they continue or are bothersome): Breast pain or tenderness Dark patches of skin on the face or other sun-exposed areas Irregular menstrual cycles or spotting Nausea Weight gain This list may not describe all possible side effects. Call your doctor for medical advice about side effects. You may report side effects to FDA at 1-800-FDA-1088. Where should I keep my medication? This does not apply. NOTE: This sheet is a summary. It may not cover all possible information. If you have questions about this medicine, talk to your doctor, pharmacist, or health care provider.  2024 Elsevier/Gold Standard (2021-02-10 00:00:00)

## 2023-02-01 NOTE — Progress Notes (Signed)
Gynecology Annual Exam  PCP: Pcp, No  Chief Complaint:  Chief Complaint  Patient presents with   Gynecologic Exam    Irregular vaginal bleeding the last two months. Cyst on right side at ER visit   Contraception    Interested in depo    History of Present Illness: Patient is a 42 y.o. G2P1011 presents for annual exam. Her BF is with her today.The patient reports she has been struggling with ongoing , irregular vaginal bleeding and was seen at the ED recently. Her iron count was rather low, and she used Lysteda, which stopped the bleeding for a few days. Today, she is still having a scant flow.  LMP: Patient's last menstrual period was 01/30/2023 (exact date). Average Interval: irregular, and she has had some ongoingvaginal bleeding over the last few weeks.Was recently seen at the ED for this and placed on Lysteda.She is not on any hormonal birth control. She is a smoker. 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  Heavy Menses: yes Clots: yes Intermenstrual Bleeding: yes Postcoital Bleeding: no Dysmenorrhea: no   The patient is sexually active. She currently uses none for contraception. She denies dyspareunia.  The patient does perform self breast exams.  There is no notable family history of breast or ovarian cancer in her family.  The patient wears seatbelts: yes.   The patient has regular exercise: no.    The patient denies current symptoms of depression.    Review of Systems: Review of Systems  Constitutional: Negative.   Eyes: Negative.   Respiratory:         Smoker. 1-1.5 ppd habit. Uses THC  Cardiovascular: Negative.        Chronic HTN- on Norvasc.  Gastrointestinal: Negative.   Genitourinary: Negative.   Musculoskeletal: Negative.   Skin:        Has a skin condition under both breasts and perianally. Has seen Dermatology for this.  Neurological: Negative.   Endo/Heme/Allergies: Negative.   Psychiatric/Behavioral: Negative.      Past Medical History:   Patient Active Problem List   Diagnosis Date Noted Date Diagnosed   Abnormal Pap smear of cervix 09/04/20 ASCUS HPV- 04/05/2022    Smoker 1 1/2 ppd 04/05/2022    Marijuana use 04/05/2022    Iron deficiency anemia 05/11/2020    Prediabetes 02/06/2020    Abnormal laboratory test 02/06/2020    Encounter to establish care 12/26/2019    History of carpal tunnel syndrome 12/26/2019    Ear drainage right 12/26/2019    Snoring 12/26/2019    Right ear impacted cerumen 12/26/2019    Hypertension 05/11/2019     Past Surgical History:  Past Surgical History:  Procedure Laterality Date   BREAST BIOPSY Right 08/07/2020   excision for infection per pt   ECTOPIC PREGNANCY SURGERY  2001   EXCISION OF BREAST BIOPSY Right 08/07/2020   Procedure: EXCISION OF BREAST BIOPSY;  Surgeon: Carolan Shiver, MD;  Location: [facility] ORS;  Service: General;  Laterality: Right;    Gynecologic History:  Patient's last menstrual period was 01/30/2023 (exact date). Contraception: none Last Pap: Results were: ASCUS in 2022. She has not had f/u last year. ASCUS with NEGATIVE high risk HPV  Last mammogram: 12/16/2021 Results were: BI-RAD III  Obstetric History: G2P1011  Family History:  Family History  Problem Relation Age of Onset   Breast cancer Paternal Grandmother        unknown age    Social History:  Social History  Socioeconomic History   Marital status: Single    Spouse name: Not on file   Number of children: 1   Years of education: Not on file   Highest education level: Not on file  Occupational History   Not on file  Tobacco Use   Smoking status: Every Day    Current packs/day: 0.50    Average packs/day: 0.5 packs/day for 22.0 years (11.0 ttl pk-yrs)    Types: Cigarettes, E-cigarettes    Passive exposure: Current   Smokeless tobacco: Never  Vaping Use   Vaping status: Former  Substance and Sexual Activity   Alcohol use: Not Currently    Alcohol/week: 2.0 standard drinks of  alcohol    Types: 2 Glasses of wine per week   Drug use: Yes    Types: Marijuana    Comment: daily   Sexual activity: Yes    Partners: Male    Birth control/protection: None  Other Topics Concern   Not on file  Social History Narrative   Lives with son   Social Determinants of Health   Financial Resource Strain: Not on file  Food Insecurity: Not on file  Transportation Needs: Not on file  Physical Activity: Not on file  Stress: Not on file  Social Connections: Not on file  Intimate Partner Violence: Not At Risk (12/28/2021)   Humiliation, Afraid, Rape, and Kick questionnaire    Fear of Current or Ex-Partner: No    Emotionally Abused: No    Physically Abused: No    Sexually Abused: No    Allergies:  No Known Allergies  Medications: Prior to Admission medications   Medication Sig Start Date End Date Taking? Authorizing Provider  amLODipine (NORVASC) 5 MG tablet Take 1 tablet (5 mg total) by mouth daily. Patient taking differently: Take 5 mg by mouth daily. Taking 10 mg daily. 07/20/22 11/02/22  Menshew, Charlesetta Ivory, PA-C  chlorthalidone (HYGROTON) 25 MG tablet Take 1 tablet (25 mg total) by mouth daily. 07/20/22 10/18/22  Menshew, Charlesetta Ivory, PA-C  ferrous sulfate 325 (65 FE) MG tablet Take 1 tablet (325 mg total) by mouth daily with breakfast. Patient not taking: No sig reported 04/30/20 09/04/20  Eulogio Bear E, NP    Physical Exam Vitals: Height 5\' 2"  (1.575 m), weight 195 lb (88.5 kg), last menstrual period 01/30/2023.  General: NAD HEENT: normocephalic, anicteric Thyroid: no enlargement, no palpable nodules Pulmonary: No increased work of breathing, CTAB Cardiovascular: RRR, distal pulses 2+ Breast: Breast symmetrical, no tenderness, no palpable nodules or masses, no skin or nipple retraction present, no nipple discharge.  No axillary or supraclavicular lymphadenopathy. Abdomen: NABS, soft, non-tender, non-distended.  Umbilicus without lesions.  No  hepatomegaly, splenomegaly or masses palpable. No evidence of hernia  Genitourinary:  External: Normal external female genitalia.  Normal urethral meatus, normal Bartholin's and Skene's glands.    Vagina: Normal vaginal mucosa, no evidence of prolapse.    Cervix: Grossly normal in appearance, no bleeding  Uterus: Non-enlarged, mobile, normal contour.  No CMT  Adnexa: ovaries non-enlarged, no adnexal masses  Rectal: deferred  Lymphatic: no evidence of inguinal lymphadenopathy Extremities: no edema, erythema, or tenderness Neurologic: Grossly intact Psychiatric: mood appropriate, affect full  Female chaperone present for pelvic and breast  portions of the physical exam    Assessment: 42 y.o. G2P1011 routine annual exam  Plan: Problem List Items Addressed This Visit   None Visit Diagnoses     Encounter for annual routine gynecological examination    -  Primary   Cervical cancer screening           1) Mammogram - recommend yearly screening mammogram.  Mammogram Was ordered today   2) STI screening  wasoffered and accepted  3) ASCCP guidelines and rational discussed.  Patient opts for every 3 years screening interval  4) Contraception - the patient is currently using  none.  She is interested in starting Contraception: her bleeding pattern would benefit from an IUD or progestin only OCP. I have advised her to stop cigarette use. Patches, gum suggested.  5) Colonoscopy -- Screening recommended starting at age 82 for average risk individuals, age 18 for individuals deemed at increased risk (including African Americans) and recommended to continue until age 71.  For patient age 81-85 individualized approach is recommended.  Gold standard screening is via colonoscopy, Cologuard screening is an acceptable alternative for patient unwilling or unable to undergo colonoscopy.  "Colorectal cancer screening for average?risk adults: 2018 guideline update from the American Cancer Society"CA: A  Cancer Journal for Clinicians: Nov 09, 2016   6) Routine healthcare maintenance including cholesterol, diabetes screening discussed managed by PCP  Extras time spent discussing hoe to address her bleeding. I will order a CBC today to recheck her iron ocount. Options to address the bleeding; progesterone pill, LARC, like nexplanon, or IUD, or perhaps ablation is she seeks a BTL. Ultimately , after discussion. She is leaning towards a Mirena IUD. She will make this appointment.  I have given her samples or iron tabs, and she is advised to increase her intake of iron rich foods and supplements.  RTC for IUD per her choosing. I have also ordered her mammogram.  7) No follow-ups on file.  Mirna Mires, CNM  02/01/2023 1:54 PM

## 2023-02-02 ENCOUNTER — Ambulatory Visit: Payer: 59 | Admitting: Obstetrics

## 2023-02-02 LAB — CBC WITH DIFFERENTIAL/PLATELET
Basophils Absolute: 0.1 10*3/uL (ref 0.0–0.2)
Basos: 1 %
EOS (ABSOLUTE): 0.2 10*3/uL (ref 0.0–0.4)
Eos: 3 %
Hematocrit: 28 % — ABNORMAL LOW (ref 34.0–46.6)
Hemoglobin: 8.3 g/dL — ABNORMAL LOW (ref 11.1–15.9)
Immature Grans (Abs): 0 10*3/uL (ref 0.0–0.1)
Immature Granulocytes: 0 %
Lymphocytes Absolute: 2.3 10*3/uL (ref 0.7–3.1)
Lymphs: 32 %
MCH: 22.3 pg — ABNORMAL LOW (ref 26.6–33.0)
MCHC: 29.6 g/dL — ABNORMAL LOW (ref 31.5–35.7)
MCV: 75 fL — ABNORMAL LOW (ref 79–97)
Monocytes Absolute: 0.9 10*3/uL (ref 0.1–0.9)
Monocytes: 12 %
Neutrophils Absolute: 3.8 10*3/uL (ref 1.4–7.0)
Neutrophils: 52 %
Platelets: 306 10*3/uL (ref 150–450)
RBC: 3.72 x10E6/uL — ABNORMAL LOW (ref 3.77–5.28)
RDW: 16.3 % — ABNORMAL HIGH (ref 11.7–15.4)
WBC: 7.3 10*3/uL (ref 3.4–10.8)

## 2023-02-03 LAB — CERVICOVAGINAL ANCILLARY ONLY
Bacterial Vaginitis (gardnerella): POSITIVE — AB
Candida Glabrata: NEGATIVE
Candida Vaginitis: NEGATIVE
Chlamydia: NEGATIVE
Comment: NEGATIVE
Comment: NEGATIVE
Comment: NEGATIVE
Comment: NEGATIVE
Comment: NEGATIVE
Comment: NORMAL
Neisseria Gonorrhea: NEGATIVE
Trichomonas: NEGATIVE

## 2023-02-06 LAB — CYTOLOGY - PAP
Comment: NEGATIVE
Diagnosis: UNDETERMINED — AB
High risk HPV: NEGATIVE

## 2023-02-07 ENCOUNTER — Encounter: Payer: Self-pay | Admitting: Obstetrics

## 2023-02-07 ENCOUNTER — Ambulatory Visit (INDEPENDENT_AMBULATORY_CARE_PROVIDER_SITE_OTHER): Payer: 59 | Admitting: Obstetrics & Gynecology

## 2023-02-07 ENCOUNTER — Other Ambulatory Visit: Payer: Self-pay

## 2023-02-07 ENCOUNTER — Other Ambulatory Visit: Payer: Self-pay | Admitting: Obstetrics

## 2023-02-07 ENCOUNTER — Encounter: Payer: Self-pay | Admitting: Obstetrics & Gynecology

## 2023-02-07 ENCOUNTER — Encounter: Payer: Self-pay | Admitting: Oncology

## 2023-02-07 VITALS — BP 140/90 | Ht 62.0 in | Wt 194.0 lb

## 2023-02-07 DIAGNOSIS — Z30430 Encounter for insertion of intrauterine contraceptive device: Secondary | ICD-10-CM

## 2023-02-07 DIAGNOSIS — D500 Iron deficiency anemia secondary to blood loss (chronic): Secondary | ICD-10-CM

## 2023-02-07 DIAGNOSIS — B9689 Other specified bacterial agents as the cause of diseases classified elsewhere: Secondary | ICD-10-CM

## 2023-02-07 DIAGNOSIS — N939 Abnormal uterine and vaginal bleeding, unspecified: Secondary | ICD-10-CM

## 2023-02-07 DIAGNOSIS — R87610 Atypical squamous cells of undetermined significance on cytologic smear of cervix (ASC-US): Secondary | ICD-10-CM

## 2023-02-07 DIAGNOSIS — N83201 Unspecified ovarian cyst, right side: Secondary | ICD-10-CM

## 2023-02-07 MED ORDER — LEVONORGESTREL 20 MCG/DAY IU IUD
1.0000 | INTRAUTERINE_SYSTEM | Freq: Once | INTRAUTERINE | Status: AC
Start: 2023-02-07 — End: 2023-02-07
  Administered 2023-02-07: 1 via INTRAUTERINE

## 2023-02-07 MED ORDER — AMLODIPINE BESYLATE 5 MG PO TABS
5.0000 mg | ORAL_TABLET | Freq: Every day | ORAL | 2 refills | Status: DC
  Filled 2023-02-07: qty 30, 30d supply, fill #0

## 2023-02-07 MED ORDER — METRONIDAZOLE 500 MG PO TABS
500.0000 mg | ORAL_TABLET | Freq: Two times a day (BID) | ORAL | 0 refills | Status: AC
Start: 2023-02-07 — End: 2023-02-14
  Filled 2023-02-07: qty 14, 7d supply, fill #0

## 2023-02-07 NOTE — Progress Notes (Signed)
    GYNECOLOGY PROGRESS NOTE  Subjective:    Patient ID: Margaret Newman, female    DOB: 12-30-1980, 42 y.o.   MRN: [id number]  HPI  Patient is a 42 y.o. G2P1011  who presents for Mirena insertion to help with her AUB/anemia due to chronic blood loss. She has been abstinent for 2 months, has not used contraception for 20 years.  The following portions of the patient's history were reviewed and updated as appropriate: allergies, current medications, past family history, past medical history, past social history, past surgical history, and problem list.  Review of Systems Pertinent items are noted in HPI.   Objective:   Blood pressure (!) 140/90, height 5\' 2"  (1.575 m), weight 194 lb (88 kg), last menstrual period 01/30/2023. Body mass index is 35.48 kg/m. Well nourished, well hydrated Black female, no apparent distress She is ambulating and conversing normally.  UPT negative, consent signed, Time out procedure done. Bimanual exam revealed a uterus NSSA, no adnexal masses or tenderness Cervix prepped with betadine and Hurricaine spray and then grasped with a single tooth tenaculum. Mirena was easily placed and the strings were cut to 3-4 cm. She tolerated the procedure well. She was given IBU 800mg  and tylenol 1000mg  after the procedure.    Assessment:   1. Right ovarian cyst   2. Iron deficiency anemia due to chronic blood loss   3. Atypical squamous cells of undetermined significance on cytologic smear of cervix (ASC-US)   4. Abnormal uterine bleeding (AUB)      Plan:   1. Right ovarian cyst  - US PELVIC COMPLETE WITH TRANSVAGINAL  2. Iron deficiency anemia due to chronic blood loss - I suspect that her bleeding will improve dramatically with the Mirena and she will not need iron infusion. She is currently taking iron pills  3. Atypical squamous cells of undetermined significance on cytologic smear of cervix (ASC-US) - pap due next year  4. Abnormal uterine  bleeding (AUB)  - levonorgestrel (MIRENA) 20 MCG/DAY IUD 1 each - IUD check in 4 weeks   HTN- she will get an appt with her primary care provider but I will re start her amilodipene now (She ran out of her meds).

## 2023-02-21 ENCOUNTER — Other Ambulatory Visit: Payer: Self-pay

## 2023-02-27 ENCOUNTER — Encounter: Payer: Self-pay | Admitting: Oncology

## 2023-03-07 ENCOUNTER — Ambulatory Visit: Payer: 59 | Admitting: Obstetrics & Gynecology

## 2023-03-07 ENCOUNTER — Ambulatory Visit: Payer: 59 | Admitting: Pediatrics

## 2023-03-07 ENCOUNTER — Encounter: Payer: Self-pay | Admitting: Pediatrics

## 2023-03-07 VITALS — BP 130/86 | HR 67 | Temp 98.2°F | Ht 63.0 in | Wt 193.8 lb

## 2023-03-07 DIAGNOSIS — J4520 Mild intermittent asthma, uncomplicated: Secondary | ICD-10-CM

## 2023-03-07 DIAGNOSIS — Z5982 Transportation insecurity: Secondary | ICD-10-CM | POA: Insufficient documentation

## 2023-03-07 DIAGNOSIS — I10 Essential (primary) hypertension: Secondary | ICD-10-CM | POA: Diagnosis not present

## 2023-03-07 DIAGNOSIS — F17200 Nicotine dependence, unspecified, uncomplicated: Secondary | ICD-10-CM

## 2023-03-07 DIAGNOSIS — R0683 Snoring: Secondary | ICD-10-CM | POA: Diagnosis not present

## 2023-03-07 DIAGNOSIS — Z131 Encounter for screening for diabetes mellitus: Secondary | ICD-10-CM

## 2023-03-07 DIAGNOSIS — D500 Iron deficiency anemia secondary to blood loss (chronic): Secondary | ICD-10-CM | POA: Diagnosis not present

## 2023-03-07 DIAGNOSIS — Z1231 Encounter for screening mammogram for malignant neoplasm of breast: Secondary | ICD-10-CM

## 2023-03-07 DIAGNOSIS — Z7689 Persons encountering health services in other specified circumstances: Secondary | ICD-10-CM

## 2023-03-07 DIAGNOSIS — K429 Umbilical hernia without obstruction or gangrene: Secondary | ICD-10-CM

## 2023-03-07 DIAGNOSIS — Z13220 Encounter for screening for lipoid disorders: Secondary | ICD-10-CM

## 2023-03-07 MED ORDER — ALBUTEROL SULFATE HFA 108 (90 BASE) MCG/ACT IN AERS: 2.0000 | INHALATION_SPRAY | Freq: Four times a day (QID) | RESPIRATORY_TRACT | 2 refills | Status: DC | PRN

## 2023-03-07 MED ORDER — AMLODIPINE-OLMESARTAN 5-20 MG PO TABS: 1.0000 | ORAL_TABLET | Freq: Every day | ORAL | 11 refills | Status: AC

## 2023-03-07 NOTE — Assessment & Plan Note (Signed)
Previously on inhaler. Will restart albuterol for now. Plan on spirometry at follow up visit. Discussed risk for COPD given smoker status. Exam reassuring today.

## 2023-03-07 NOTE — Assessment & Plan Note (Signed)
Smokes 1 pack per day. Not interested in nicotine replacement but is starting to think about cutting back. Follow up at next visit.

## 2023-03-07 NOTE — Assessment & Plan Note (Signed)
STOPBANG 4-5. High risk for underlying sleep apnea. Will send for home sleep study.

## 2023-03-07 NOTE — Progress Notes (Addendum)
Acute Visit  BP 130/86   Pulse 67   Temp 98.2 F (36.8 C) (Oral)   Ht 5\' 3"  (1.6 m)   Wt 193 lb 12.8 oz (87.9 kg)   LMP 03/07/2023   SpO2 95%   BMI 34.33 kg/m    Subjective:    Patient ID: Margaret Newman, female    DOB: July 18, 1980, 42 y.o.   MRN: [id number]  HPI: Margaret Newman is a 42 y.o. female  Chief Complaint  Patient presents with   Hypertension    Patient wants to get blood pressure under control    HTN Has intermittently been not taking medications No headaches, chest pain, sob, palpations, orthopnea, dyspnea, PND, lower extremity edema. Has been walking more since her car broke down She smokes about a cigarette a day, has thought about cutting back  AUB Follows with gynecolocy She has mirena in place,  Had cramping, resolved now Has regular follow up scheduled w them Iron deficiency- taking supplements  H/o asthma? Has had some asthma attacks? Previously on an inhaler in [place] She was due for a sleep apnea screening before pandemic due to snoring and difficulty w coughing at night/wakes up gasping for air Day time somnolence  HM Will be due for colonoscopy this year Missed mammogram UTD on pap, follows with gynecology Declines Flu and COVID  Has no transportation, walked to visit. Missed mammo and gynecology follow up.  Relevant past medical, surgical, family and social history reviewed and updated as indicated. Interim medical history since our last visit reviewed. Allergies and medications reviewed and updated.  ROS per HPI unless specifically indicated above     Objective:    BP 130/86   Pulse 67   Temp 98.2 F (36.8 C) (Oral)   Ht 5\' 3"  (1.6 m)   Wt 193 lb 12.8 oz (87.9 kg)   LMP 03/07/2023   SpO2 95%   BMI 34.33 kg/m   Wt Readings from Last 3 Encounters:  03/07/23 193 lb 12.8 oz (87.9 kg)  02/07/23 194 lb (88 kg)  02/01/23 195 lb (88.5 kg)     Physical Exam Constitutional:      Appearance: Normal appearance.  HENT:      Head: Normocephalic and atraumatic.  Eyes:     Pupils: Pupils are equal, round, and reactive to light.  Cardiovascular:     Rate and Rhythm: Normal rate and regular rhythm.     Pulses: Normal pulses.     Heart sounds: Normal heart sounds.  Pulmonary:     Effort: Pulmonary effort is normal.     Breath sounds: Normal breath sounds.  Abdominal:     General: Abdomen is flat. Bowel sounds are normal.     Palpations: Abdomen is soft.     Tenderness: There is no guarding or rebound.     Hernia: A hernia is present. Hernia is present in the umbilical area.     Comments: Reducible umbilical hernia, tender  Musculoskeletal:        General: Normal range of motion.     Cervical back: Normal range of motion.  Skin:    General: Skin is warm and dry.     Capillary Refill: Capillary refill takes less than 2 seconds.  Neurological:     General: No focal deficit present.     Mental Status: She is alert. Mental status is at baseline.  Psychiatric:        Mood and Affect: Mood normal.  Behavior: Behavior normal.        Assessment & Plan:  Assessment & Plan   Primary hypertension Assessment & Plan: Previously diagnosed, elevated readings during prior visits. May benefit from [facility] goa <130/80. Does not like chlorthalidone 2/2 frequent urination. Will switch to combination amlodipine 5mg  and olmesartan 20mg . Recheck in 1 month.  Orders: -     Comprehensive metabolic panel -     amLODIPine-Olmesartan; Take 1 tablet by mouth daily.  Dispense: 30 tablet; Refill: 11  Mild intermittent asthma, unspecified whether complicated Assessment & Plan: Previously on inhaler. Will restart albuterol for now. Plan on spirometry at follow up visit. Discussed risk for COPD given smoker status. Exam reassuring today.  Orders: -     Albuterol Sulfate HFA; Inhale 2 puffs into the lungs every 6 (six) hours as needed for wheezing or shortness of breath.  Dispense: 8 g; Refill: 2  Iron deficiency anemia  due to chronic blood loss Assessment & Plan: Secondary to chronic blood loss. Improving w mirena in place. Follows with gynecology. Taking supplements. Consider repeating levels at follow up visit.  Orders: -     CBC with Differential/Platelet  Snoring Assessment & Plan: STOPBANG 4-5. High risk for underlying sleep apnea. Will send for home sleep study.  Orders: -     Home sleep test  Transportation insecurity due to lack of access to vehicle Assessment & Plan: Limits ability to attend visits. CM referral placed.  Orders: -     AMB Referral to Managed Medicaid Care Management  Current every day smoker Assessment & Plan: Smokes 1 pack per day. Not interested in nicotine replacement but is starting to think about cutting back. Follow up at next visit.  Umibilical hernia Reducible but painful. Sent referral to surgery. Pt given strict return precautions and anticipatory guidance for ED given risk for strangulation/incarceration.  Encounter to establish care Reviewed patient record including history, medications, problem list. Will schedule for physical.  Encounter for screening mammogram for malignant neoplasm of breast -     3D Screening Mammogram, Left and Right; Future  Diabetes mellitus screening -     Hemoglobin A1c  Lipid screening -     Lipid panel  Follow up plan: Return in about 4 weeks (around 04/04/2023) for HTN, Physical.  Colena Ketterman Howell Pringle, MD

## 2023-03-07 NOTE — Patient Instructions (Addendum)
Nice to meet you.   Plan for today: - labs: diabetes screen, liver, kidney function, electrolytes, cholesterol - mammogram order placed - albuterol inhaler and blood pressure medications sent. Let me know if too pricey, can split up the blood pressure medications if needed.  You will be due for colonoscopy in February, will place the future order

## 2023-03-07 NOTE — Assessment & Plan Note (Signed)
Secondary to chronic blood loss. Improving w mirena in place. Follows with gynecology. Taking supplements. Consider repeating levels at follow up visit.

## 2023-03-07 NOTE — Addendum Note (Signed)
Addended by: Jackolyn Confer on: 03/07/2023 12:44 PM   Modules accepted: Orders

## 2023-03-07 NOTE — Assessment & Plan Note (Signed)
Limits ability to attend visits. CM referral placed.

## 2023-03-07 NOTE — Assessment & Plan Note (Signed)
Previously diagnosed, elevated readings during prior visits. May benefit from [facility] goa <130/80. Does not like chlorthalidone 2/2 frequent urination. Will switch to combination amlodipine 5mg  and olmesartan 20mg . Recheck in 1 month.

## 2023-03-08 LAB — COMPREHENSIVE METABOLIC PANEL
ALT: 10 IU/L (ref 0–32)
AST: 12 IU/L (ref 0–40)
Albumin: 4.1 g/dL (ref 3.9–4.9)
Alkaline Phosphatase: 105 IU/L (ref 44–121)
BUN/Creatinine Ratio: 9 (ref 9–23)
BUN: 7 mg/dL (ref 6–24)
Bilirubin Total: 0.4 mg/dL (ref 0.0–1.2)
CO2: 21 mmol/L (ref 20–29)
Calcium: 9 mg/dL (ref 8.7–10.2)
Chloride: 103 mmol/L (ref 96–106)
Creatinine, Ser: 0.74 mg/dL (ref 0.57–1.00)
Globulin, Total: 3 g/dL (ref 1.5–4.5)
Glucose: 90 mg/dL (ref 70–99)
Potassium: 4 mmol/L (ref 3.5–5.2)
Sodium: 139 mmol/L (ref 134–144)
Total Protein: 7.1 g/dL (ref 6.0–8.5)
eGFR: 102 mL/min/{1.73_m2} (ref >59)

## 2023-03-08 LAB — CBC WITH DIFFERENTIAL/PLATELET
Basophils Absolute: 0 10*3/uL (ref 0.0–0.2)
Basos: 1 %
EOS (ABSOLUTE): 0.2 10*3/uL (ref 0.0–0.4)
Eos: 4 %
Hematocrit: 33.7 % — ABNORMAL LOW (ref 34.0–46.6)
Hemoglobin: 9.7 g/dL — ABNORMAL LOW (ref 11.1–15.9)
Immature Grans (Abs): 0 10*3/uL (ref 0.0–0.1)
Immature Granulocytes: 0 %
Lymphocytes Absolute: 1.9 10*3/uL (ref 0.7–3.1)
Lymphs: 37 %
MCH: 23 pg — ABNORMAL LOW (ref 26.6–33.0)
MCHC: 28.8 g/dL — ABNORMAL LOW (ref 31.5–35.7)
MCV: 80 fL (ref 79–97)
Monocytes Absolute: 0.9 10*3/uL (ref 0.1–0.9)
Monocytes: 19 %
Neutrophils Absolute: 2 10*3/uL (ref 1.4–7.0)
Neutrophils: 39 %
Platelets: 260 10*3/uL (ref 150–450)
RBC: 4.21 x10E6/uL (ref 3.77–5.28)
RDW: 18.2 % — ABNORMAL HIGH (ref 11.7–15.4)
WBC: 5 10*3/uL (ref 3.4–10.8)

## 2023-03-08 LAB — LIPID PANEL
Chol/HDL Ratio: 5.9 ratio — ABNORMAL HIGH (ref 0.0–4.4)
Cholesterol, Total: 170 mg/dL (ref 100–199)
HDL: 29 mg/dL — ABNORMAL LOW (ref >39)
LDL Chol Calc (NIH): 125 mg/dL — ABNORMAL HIGH (ref 0–99)
Triglycerides: 82 mg/dL (ref 0–149)
VLDL Cholesterol Cal: 16 mg/dL (ref 5–40)

## 2023-03-08 LAB — HEMOGLOBIN A1C
Est. average glucose Bld gHb Est-mCnc: 120 mg/dL
Hgb A1c MFr Bld: 5.8 % — ABNORMAL HIGH (ref 4.8–5.6)

## 2023-03-13 ENCOUNTER — Encounter: Payer: Self-pay | Admitting: Oncology

## 2023-03-14 ENCOUNTER — Ambulatory Visit: Payer: 59 | Admitting: General Surgery

## 2023-03-21 ENCOUNTER — Ambulatory Visit: Payer: 59 | Admitting: General Surgery

## 2023-03-24 ENCOUNTER — Telehealth: Payer: Self-pay

## 2023-03-24 NOTE — Telephone Encounter (Signed)
   Telephone encounter was:  Successful.  03/24/2023 Name: Lakeena Downie MRN: [id number] DOB: [birth year]  Valentina Lucks is a 42 y.o. year old female who is a primary care patient of Evelene Croon Atilano Median, MD . The community resource team was consulted for assistance with Transportation Needs   Care guide performed the following interventions: Spoke with patient to inform her I spoke with Marcelino Duster at [facility] and she is on the schedule for her 04/04/23 10:40am appointment. Unfortunately they are unable to accommodate the 10/17 2pm due to short notice the schedule is full and there is no availability. Gave patient Michelle's number at [facility] [phone]. Reminded patient that [facility] needs at least a week notice prior to appointments. Patient stated she will ask family to take her to the 03/30/23 appointment. She will also check her insurance to see if the has a transportation benefit. I asked if any other assistance is needed and she responded she does not have any other needs at this time.  Follow Up Plan:  No further follow up planned at this time. The patient has been provided with needed resources.  Janyah Singleterry [facility]  [facility] Guide Direct Dial: [phone]  Website: [email or web address]

## 2023-03-30 ENCOUNTER — Ambulatory Visit: Payer: 59 | Admitting: General Surgery

## 2023-04-04 ENCOUNTER — Encounter: Payer: 59 | Admitting: Pediatrics

## 2023-04-04 NOTE — Progress Notes (Deleted)
LMP 03/07/2023    Annual Physical Exam - Female  Subjective:   CC: No chief complaint on file.   Margaret Newman is a 42 y.o. female patient here for a preventative health maintenance exam{Blank Single :19197::" and has no acute complaints.",". Additional topics discussed include:"}  HTN Combination amlodipine 5mg  and olmesartan 20mg  started last visit 03/07/23 ***No headaches, chest pain, sob, palpations, orthopnea, dyspnea, PND, lower extremity edema.  #umbilical hernia  #copd Plan on spiro?  Health Habits: DIET: {Desc; diets:16563} EXERCISE: *** times/week on average, activities include {misc; exercise types:16438} DENTAL EXAM: {UTDSTATUS:31041} EYE EXAM: {UTDSTATUS:31041}                       Relevant Gynecologic History LMP: Patient's last menstrual period was 03/07/2023.  Menstrual Status: {Menopause:31378}, Flow {Misc; menses description:16152} PAP History:  Result Date Procedure Results Follow-ups Next Due  02/01/2023 Cytology - PAP High risk HPV: Negative Adequacy: Satisfactory for evaluation; transformation zone component PRESENT. Diagnosis: - Atypical squamous cells of undetermined significance (ASC-US) Comment: Normal Reference Range HPV - Negative    09/04/2020 Cytology - PAP High risk HPV: Negative Neisseria Gonorrhea: Negative Chlamydia: Negative Adequacy: Satisfactory for evaluation; transformation zone component PRESENT. Diagnosis: - Atypical squamous cells of undetermined significance (ASC-US) Comment: Normal Reference Range HPV - Negative Comment: Normal Reference Ranger Chlamydia - Negative Comment: Normal Reference Range Neisseria Gonorrhea - Negative      History abnormal PAP: Yes  Sexual activity: {sexual partners:315163} Family history breast, ovarian cancer: {yes/no:20286} Domestic Violence Screen, feels safe at home: {yes/no:20286}  family history includes Breast cancer in her paternal grandmother.  Social History   Tobacco Use    Smoking status: Every Day    Current packs/day: 0.50    Average packs/day: 0.5 packs/day for 22.0 years (11.0 ttl pk-yrs)    Types: Cigarettes, E-cigarettes    Passive exposure: Current   Smokeless tobacco: Never  Vaping Use   Vaping status: Former  Substance Use Topics   Alcohol use: Not Currently    Alcohol/week: 2.0 standard drinks of alcohol    Types: 2 Glasses of wine per week   Drug use: Yes    Types: Marijuana    Comment: daily   Social History   Social History Narrative   Lives with son    Social drivers questionnaire is reviewed and is positive for : {SDOH Challenges:24934}. Follow up: {sdoh follow up:67204::"None"}  Depression Screening:      No data to display              No data to display          Mental Health Plan: {[phone]}  Self Management Goals  Goals   None     Health Maintenance Colon Cancer Screening : {UTDSTATUS:31041} Mammogram : {UTDSTATUS:31041} DXA scan : {UTDSTATUS:31041} Immunizations : {Immunizations:5306}  Review of Systems See HPI for relevant ROS.  Outpatient Medications Prior to Visit  Medication Sig Dispense Refill   albuterol (VENTOLIN HFA) 108 (90 Base) MCG/ACT inhaler Inhale 2 puffs into the lungs every 6 (six) hours as needed for wheezing or shortness of breath. 8 g 2   amLODipine-olmesartan (AZOR) 5-20 MG tablet Take 1 tablet by mouth daily. 30 tablet 11   No facility-administered medications prior to visit.     Patient Active Problem List   Diagnosis Date Noted   Transportation insecurity due to lack of access to vehicle 03/07/2023   Mild intermittent asthma 03/07/2023   Abnormal uterine bleeding (AUB) 02/07/2023  Abnormal Pap smear of cervix 09/04/20 ASCUS HPV- 04/05/2022   Current every day smoker 04/05/2022   Marijuana use 04/05/2022   Iron deficiency anemia 05/11/2020   Snoring 12/26/2019   Hypertension 05/11/2019    Objective:   There were no vitals filed for this visit.  There is no  height or weight on file to calculate BMI.  Physical Exam ***   Assessment and Plan:   There are no diagnoses linked to this encounter.   This plan was discussed with the patient and questions were answered. There were no further concerns.  Follow up as indicated, or sooner should any new problems arise, if conditions worsen, or if they are otherwise concerned.   See patient instructions for additional information.  Margaret Lacaze Howell Pringle, MD  Family Medicine      Future Appointments  Date Time Provider Department Center  04/04/2023 10:40 AM Jackolyn Confer, MD [facility]

## 2023-05-26 ENCOUNTER — Ambulatory Visit: Payer: 59 | Admitting: Obstetrics

## 2023-05-26 DIAGNOSIS — Z30431 Encounter for routine checking of intrauterine contraceptive device: Secondary | ICD-10-CM

## 2023-05-29 ENCOUNTER — Ambulatory Visit: Payer: 59 | Admitting: Obstetrics

## 2023-05-30 ENCOUNTER — Ambulatory Visit: Payer: 59 | Admitting: Pediatrics

## 2023-05-31 ENCOUNTER — Telehealth: Payer: Self-pay | Admitting: Obstetrics and Gynecology

## 2023-05-31 ENCOUNTER — Other Ambulatory Visit: Payer: 59

## 2023-05-31 NOTE — Telephone Encounter (Signed)
Reached out to pt about gyn [place] that was scheduled on 05/31/2023 at 3:00.  Could not leave a message bc call could not be completed as dialed.

## 2023-06-01 ENCOUNTER — Encounter: Payer: Self-pay | Admitting: Obstetrics

## 2023-06-01 ENCOUNTER — Encounter: Payer: Self-pay | Admitting: Obstetrics & Gynecology

## 2023-06-01 NOTE — Telephone Encounter (Signed)
Reached out to pt (2x) about gyn [place] that was scheduled on 05/31/2023 at 3:00.  Could not leave a message bc call could not be completed/phone was not in service.  Will send a Mychart letter.

## 2023-06-05 ENCOUNTER — Encounter: Payer: Self-pay | Admitting: Obstetrics & Gynecology

## 2024-01-08 ENCOUNTER — Telehealth: Payer: Self-pay | Admitting: Physician Assistant

## 2024-01-08 ENCOUNTER — Encounter (INDEPENDENT_AMBULATORY_CARE_PROVIDER_SITE_OTHER): Payer: Self-pay

## 2024-01-08 ENCOUNTER — Encounter: Payer: Self-pay | Admitting: Oncology

## 2024-01-08 DIAGNOSIS — H01002 Unspecified blepharitis right lower eyelid: Secondary | ICD-10-CM

## 2024-01-08 MED ORDER — ERYTHROMYCIN 5 MG/GM OP OINT: 1.0000 | TOPICAL_OINTMENT | Freq: Every day | OPHTHALMIC | 0 refills | Status: AC

## 2024-01-08 NOTE — Progress Notes (Signed)
  E-Visit for Eye Problem   We are sorry that you are not feeling well. Here is how we plan to help!  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 This is an infection of the eyelid from blocked Meibomian glands, these are the glands that secrete oil located at the base of the eyelashes.  We have made appropriate suggestions for you based upon your presentation: I have prescribed Erythromycin  Ophthalmic ointment 0.5% Apply topically in affected eye daily at bedtime for 5 days. To apply: Tilt the head back and, pressing your finger gently on the skin just beneath the lower eyelid, pull the lower eyelid away from the eye to make a space. Squeeze a thin strip of ointment into this space. A 1-cm (approximately 1/3-inch) strip of ointment is usually enough, unless you have been told by your doctor to use a different amount. Let go of the eyelid and gently close the eyes. Keep the eyes closed for 1 or 2 minutes to allow the medicine to come into contact with the infection.      HOME CARE:  Wash your hands often! Let the stye open on its own. Don't squeeze or open it. Don't rub your eyes. This can irritate your eyes and let in bacteria.  If you need to touch your eyes, wash your hands first. Don't wear eye makeup or contact lenses until the area has healed.  GET HELP RIGHT AWAY IF:  Your symptoms do not improve. You develop blurred or loss of vision. Your symptoms worsen (increased discharge, pain or redness).   Thank you for choosing an e-visit.  Your e-visit answers were reviewed by a board certified advanced clinical practitioner to complete your personal care plan. Depending upon the condition, your plan could have included both over the counter or prescription medications.  Please review your pharmacy choice. Make sure the pharmacy is open so you can pick up prescription now. If there is a problem, you may contact your provider through [employer] and have  the prescription routed to another pharmacy.  Your safety is important to us . If you have drug allergies check your prescription carefully.   For the next 24 hours you can use MyChart to ask questions about today's visit, request a non-urgent call back, or ask for a work or school excuse. You will get an email in the next two days asking about your experience. I hope that your e-visit has been valuable and will speed your recovery.   I have spent 5 minutes in review of e-visit questionnaire, review and updating patient chart, medical decision making and response to patient.   Delon CHRISTELLA Dickinson, PA-C

## 2024-01-16 ENCOUNTER — Encounter: Payer: Self-pay | Admitting: Oncology

## 2024-06-14 ENCOUNTER — Emergency Department: Payer: Self-pay | Admitting: Radiology

## 2024-06-14 ENCOUNTER — Encounter: Payer: Self-pay | Admitting: Oncology

## 2024-06-14 ENCOUNTER — Other Ambulatory Visit: Payer: Self-pay

## 2024-06-14 ENCOUNTER — Emergency Department: Payer: Self-pay

## 2024-06-14 ENCOUNTER — Emergency Department
Admission: EM | Admit: 2024-06-14 | Discharge: 2024-06-14 | Disposition: A | Discharge status: Home / Self Care | Payer: Self-pay | Source: Home / Self Care | Attending: Emergency Medicine | Admitting: Emergency Medicine

## 2024-06-14 DIAGNOSIS — I10 Essential (primary) hypertension: Secondary | ICD-10-CM | POA: Insufficient documentation

## 2024-06-14 DIAGNOSIS — N611 Abscess of the breast and nipple: Secondary | ICD-10-CM | POA: Insufficient documentation

## 2024-06-14 DIAGNOSIS — R7303 Prediabetes: Secondary | ICD-10-CM | POA: Insufficient documentation

## 2024-06-14 DIAGNOSIS — J45909 Unspecified asthma, uncomplicated: Secondary | ICD-10-CM | POA: Insufficient documentation

## 2024-06-14 HISTORY — PX: IR US GUIDE BX ASP/DRAIN: IMG2392

## 2024-06-14 LAB — CBC WITH DIFFERENTIAL/PLATELET
Abs Immature Granulocytes: 0.02 K/uL (ref 0.00–0.07)
Basophils Absolute: 0.1 K/uL (ref 0.0–0.1)
Basophils Relative: 1 %
Eosinophils Absolute: 0.2 K/uL (ref 0.0–0.5)
Eosinophils Relative: 3 %
HCT: 37.9 % (ref 36.0–46.0)
Hemoglobin: 12 g/dL (ref 12.0–15.0)
Immature Granulocytes: 0 %
Lymphocytes Relative: 29 %
Lymphs Abs: 2 K/uL (ref 0.7–4.0)
MCH: 28 pg (ref 26.0–34.0)
MCHC: 31.7 g/dL (ref 30.0–36.0)
MCV: 88.6 fL (ref 80.0–100.0)
Monocytes Absolute: 0.7 K/uL (ref 0.1–1.0)
Monocytes Relative: 10 %
Neutro Abs: 3.9 K/uL (ref 1.7–7.7)
Neutrophils Relative %: 57 %
Platelets: 235 K/uL (ref 150–400)
RBC: 4.28 MIL/uL (ref 3.87–5.11)
RDW: 14.5 % (ref 11.5–15.5)
WBC: 6.8 K/uL (ref 4.0–10.5)
nRBC: 0 % (ref 0.0–0.2)

## 2024-06-14 LAB — BASIC METABOLIC PANEL WITH GFR
Anion gap: 9 (ref 5–15)
BUN: 8 mg/dL (ref 6–20)
CO2: 25 mmol/L (ref 22–32)
Calcium: 8.9 mg/dL (ref 8.9–10.3)
Chloride: 106 mmol/L (ref 98–111)
Creatinine, Ser: 0.57 mg/dL (ref 0.44–1.00)
GFR, Estimated: >60 mL/min
Glucose, Bld: 82 mg/dL (ref 70–99)
Potassium: 3.9 mmol/L (ref 3.5–5.1)
Sodium: 140 mmol/L (ref 135–145)

## 2024-06-14 MED ORDER — ONDANSETRON HCL 4 MG/2ML IJ SOLN
4.0000 mg | Freq: Once | INTRAMUSCULAR | Status: AC
  Administered 2024-06-14: 4 mg via INTRAVENOUS
  Filled 2024-06-14: qty 2

## 2024-06-14 MED ORDER — FLUCONAZOLE 150 MG PO TABS
ORAL_TABLET | ORAL | 0 refills | Status: DC
  Filled 2024-06-14: qty 2, 7d supply, fill #0

## 2024-06-14 MED ORDER — LIDOCAINE 1 % OPTIME INJ - NO CHARGE
5.0000 mL | Freq: Once | INTRAMUSCULAR | Status: AC
  Administered 2024-06-14: 5 mL via INTRADERMAL

## 2024-06-14 MED ORDER — MORPHINE SULFATE (PF) 4 MG/ML IV SOLN
4.0000 mg | Freq: Once | INTRAVENOUS | Status: AC
  Administered 2024-06-14: 4 mg via INTRAVENOUS
  Filled 2024-06-14: qty 1

## 2024-06-14 MED ORDER — PIPERACILLIN-TAZOBACTAM 3.375 G IVPB 30 MIN
3.3750 g | Freq: Once | INTRAVENOUS | Status: AC
  Administered 2024-06-14: 3.375 g via INTRAVENOUS
  Filled 2024-06-14: qty 50

## 2024-06-14 MED ORDER — SODIUM CHLORIDE 0.9 % IV BOLUS
1000.0000 mL | Freq: Once | INTRAVENOUS | Status: AC
  Administered 2024-06-14: 1000 mL via INTRAVENOUS

## 2024-06-14 MED ORDER — CLINDAMYCIN HCL 150 MG PO CAPS
300.0000 mg | ORAL_CAPSULE | Freq: Three times a day (TID) | ORAL | 0 refills | Status: AC
  Filled 2024-06-14: qty 24, 4d supply, fill #0
  Filled 2024-06-14: qty 18, 3d supply, fill #0

## 2024-06-14 MED ORDER — OXYCODONE HCL 5 MG PO TABS
5.0000 mg | ORAL_TABLET | Freq: Four times a day (QID) | ORAL | 0 refills | Status: AC | PRN
  Filled 2024-06-14: qty 15, 4d supply, fill #0

## 2024-06-14 NOTE — ED Triage Notes (Signed)
 Pt to ED for c/o abscess on right breast. States pain began five days ago, states swelling and inflammation to area. Pt has been taking Tylenol  and Motrin  for pain.

## 2024-06-14 NOTE — Discharge Instructions (Signed)
 Follow-up with your regular doctor, open-door clinic, or urgent care for a referral to the [facility] in 1 week.  Hopefully if you call your regular doctor she will do this for you since it is a large area of infection. Return emergency department if you are worsening Take the antibiotic as prescribed Tylenol  and ibuprofen  for pain, oxycodone for pain not controlled by these medications Use cold cabbage leaves on the breast to help with inflammation and pain

## 2024-06-14 NOTE — ED Provider Notes (Signed)
 "  [facility] Provider Note    Event Date/Time   First MD Initiated Contact with Patient 06/14/24 1307     (approximate)   History   Abscess   HPI  Margaret Newman is a 44 y.o. female history of hypertension, asthma, prediabetes, and anemia presents emergency department with redness, swelling and pain to the right breast.  Patient states she has a history of abscesses to the area.  Had surgery on it in 2003.  States now throat is come back.  Started having some pustules around her areola.  Now that she has a lot of redness and swelling to the area.  No fever or chills.      Physical Exam   Triage Vital Signs: ED Triage Vitals  Encounter Vitals Group     BP 06/14/24 1237 (!) 171/102     Girls Systolic BP Percentile --      Girls Diastolic BP Percentile --      Boys Systolic BP Percentile --      Boys Diastolic BP Percentile --      Pulse Rate 06/14/24 1237 78     Resp 06/14/24 1237 16     Temp 06/14/24 1237 98.6 F (37 C)     Temp Source 06/14/24 1237 Oral     SpO2 06/14/24 1237 97 %     Weight 06/14/24 1243 196 lb (88.9 kg)     Height 06/14/24 1243 5' 2 (1.575 m)     Head Circumference --      Peak Flow --      Pain Score 06/14/24 1240 8     Pain Loc --      Pain Education --      Exclude from Growth Chart --     Most recent vital signs: Vitals:   06/14/24 1237  BP: (!) 171/102  Pulse: 78  Resp: 16  Temp: 98.6 F (37 C)  SpO2: 97%     General: Awake, no distress.   CV:  Good peripheral perfusion. Resp:  Normal effort. Abd:  No distention.   Other:  Right breast with large amount of redness and swelling, tenderness, see photos in media   ED Results / Procedures / Treatments   Labs (all labs ordered are listed, but only abnormal results are displayed) Labs Reviewed  AEROBIC/ANAEROBIC CULTURE W GRAM STAIN (SURGICAL/DEEP WOUND)  BASIC METABOLIC PANEL WITH GFR  CBC WITH DIFFERENTIAL/PLATELET      EKG     RADIOLOGY Ultrasound the right breast for absence    PROCEDURES:   Procedures  Critical Care:  no Chief Complaint  Patient presents with   Abscess      MEDICATIONS ORDERED IN ED: Medications  sodium chloride  0.9 % bolus 1,000 mL (0 mLs Intravenous Stopped 06/14/24 1504)  piperacillin -tazobactam (ZOSYN ) IVPB 3.375 g (0 g Intravenous Stopped 06/14/24 1421)  morphine  (PF) 4 MG/ML injection 4 mg (4 mg Intravenous Given 06/14/24 1352)  ondansetron  (ZOFRAN ) injection 4 mg (4 mg Intravenous Given 06/14/24 1352)  lidocaine  (XYLOCAINE ) injection 1 % - NO CHARGE (5 mLs Intradermal Given 06/14/24 1501)  morphine  (PF) 4 MG/ML injection 4 mg (4 mg Intravenous Given 06/14/24 1523)     IMPRESSION / MDM / ASSESSMENT AND PLAN / ED COURSE  I reviewed the triage vital signs and the nursing notes.  Differential diagnosis includes, but is not limited to, abscess, cellulitis, breast cancer  Patient's presentation is most consistent with acute illness / injury with system symptoms.   Medications given: Zosyn  IV, morphine  4 mg  Labs are reassuring  Ultrasound of the right breast, radiologist called to notify that she does have a large abscess.  He has already spoken with IR for needle aspiration.  Will need to order needle aspiration from the ED.  Plan at this time is to have her follow-up with the [facility] center in 1 week, discharged her on antibiotics.  Patient taken to IR for aspiration.  Was noted to have at least 10 mL of fluid in the breast.  Was sent to the lab for culture  Gave patient another dose of morphine  4 mg following aspiration as she is having pain  Will place patient on clindamycin  p.o., oxycodone  as needed, Diflucan  to prevent yeast.  She is to follow-up with the [place] [facility] center in 1 week.  Since we cannot do the referral from the ED I did encourage her to just call and reach out to her regular doctor.  Otherwise she could  hopefully go to urgent care or open-door clinic for the referral.  Return emergency department for worsening.  She is in agreement treatment plan.  Discharged stable condition.      FINAL CLINICAL IMPRESSION(S) / ED DIAGNOSES   Final diagnoses:  Breast abscess     Rx / DC Orders   ED Discharge Orders          Ordered    clindamycin  (CLEOCIN ) 150 MG capsule  3 times daily        06/14/24 1531    fluconazole  (DIFLUCAN ) 150 MG tablet        06/14/24 1531    oxyCODONE  (ROXICODONE ) 5 MG immediate release tablet  Every 6 hours PRN        06/14/24 1531             Note:  This document was prepared using Dragon voice recognition software and may include unintentional dictation errors.    Gasper Devere ORN, PA-C 06/14/24 1545    Jossie Artist POUR, MD 06/15/24 [phone]  "

## 2024-06-14 NOTE — Procedures (Signed)
 Interventional Radiology Procedure Note  Procedure: US  guided aspiration of right breast abscess yielding 10 mL thick purulent fluid.   Complications: None  Estimated Blood Loss: None  Recommendations: - Sent for culture   Signed,  Wilkie LOIS Lent, MD

## 2024-06-17 LAB — AEROBIC/ANAEROBIC CULTURE W GRAM STAIN (SURGICAL/DEEP WOUND): Special Requests: NORMAL

## 2024-06-18 ENCOUNTER — Other Ambulatory Visit: Payer: Self-pay | Admitting: Obstetrics and Gynecology

## 2024-06-18 DIAGNOSIS — N611 Abscess of the breast and nipple: Secondary | ICD-10-CM

## 2024-06-18 NOTE — Addendum Note (Signed)
 Addended by: ROGERIO TEMPIE SQUIBB on: 06/18/2024 04:23 PM   Modules accepted: Orders

## 2024-06-19 NOTE — Progress Notes (Signed)
 [name] is a 44 y.o. G24P1011 female who presents to [facility] clinic today with complaint of right breast lump x 2 years that became red x 1 month that patient went to ED 06/14/2024 that an antibiotic was started for breast abscess. Fluid was drained that showed staphylococcus lugdeunesis in the ED. Patient stated the lump drained a bloody white discharge over the weekend with some improvement in pain and decrease in lump. Patient currently rates the pain at a 1 out of 10 that comes and goes. Patient stated it was a 10 out of 10. Patient completed of a left breast lump and left nipple discharge x 6 months. Patient stated the discharge is yellow and spontaneous at times.    Pap Smear: Pap smear completed today. Last Pap smear was 11/01/2022 at [facility] clinic and was abnormal - ASCUS with negative HPV. Per patient has history of an abnormal Pap smear 08/15/2020 that was ASCUS with negative HPV. Last Pap smear result is available in Epic.   Physical exam: Breasts Breasts symmetrical. Two scabbed areas right breast at 3 o'clock 1 cm from nipple and 4 o'clock 7 cm from the nipple. Redness observed between 12 o'clock and 6 o'clock next to nipple. Scars observed bilateral breasts from healed sores. No nipple retraction bilateral breasts. No nipple discharge right breast. Expressed a yellow discharge from the left nipple on exam. Sample of discharge sent to Cytology for evaluation. No lymphadenopathy. Palpated a lump within the right breast next to nipple between 12 o'clock and 6 o'clock that measures 8 cm x 7 cm. Palpated a lump within the left axilla at 1 o'clock 18 cm from the nipple. Complaints of tenderness when palpated right breast lump on exam.     Pelvic/Bimanual Ext Genitalia No lesions, no swelling and no discharge observed on external genitalia.        Vagina Vagina pink and normal texture. No lesions or discharge observed in vagina.        Cervix Cervix is present. Cervix pink and of  normal texture. IUD strings visualized. No discharge observed.    Uterus Uterus is present and palpable. Uterus in normal position and normal size.        Adnexae Bilateral ovaries present and palpable. No tenderness on palpation.         Rectovaginal No rectal exam completed today since patient had no rectal complaints. No skin abnormalities observed on exam.     Smoking History: Patient is a current smoker. Discussed smoking cessation with patient. Referred to the [facility] Quitline.   Patient Navigation: Patient education provided. Access to services provided for patient through [facility] program.   Colorectal Cancer Screening: Per patient has never had colonoscopy completed. FIT Test given to patient to complete.No complaints today.    Breast and Cervical Cancer Risk Assessment: Patient does not have family history of breast cancer, known genetic mutations, or radiation treatment to the chest before age 49. Patient does not have history of cervical dysplasia, immunocompromised, or DES exposure in-utero.  Risk Scores as of Encounter on 06/20/2024     Margaret Newman           5-year 0.9%   Lifetime 7.85%            Last calculated by Margaret Newman, CMA on 06/20/2024 at 11:31 AM       A: [facility] exam with pap smear Complaint of right breast lump, pain, drainage and left breast discharge.  P: Referred patient to the [facility] for a diagnostic mammogram. Appointment scheduled Thursday, June 20, 2024 at 1245.  Margaret Wanda SQUIBB, RN 06/19/2024 11:11 AM

## 2024-06-20 ENCOUNTER — Ambulatory Visit: Payer: Self-pay | Admitting: *Deleted

## 2024-06-20 ENCOUNTER — Ambulatory Visit
Admission: RE | Admit: 2024-06-20 | Discharge: 2024-06-20 | Disposition: A | Discharge status: Home / Self Care | Payer: Self-pay | Source: Ambulatory Visit | Attending: Obstetrics and Gynecology | Admitting: Obstetrics and Gynecology

## 2024-06-20 ENCOUNTER — Encounter: Payer: Self-pay | Admitting: Oncology

## 2024-06-20 ENCOUNTER — Other Ambulatory Visit (HOSPITAL_COMMUNITY)
Admission: RE | Admit: 2024-06-20 | Discharge: 2024-06-20 | Disposition: A | Payer: Self-pay | Source: Ambulatory Visit | Attending: Obstetrics and Gynecology | Admitting: Obstetrics and Gynecology

## 2024-06-20 ENCOUNTER — Other Ambulatory Visit: Payer: Self-pay | Admitting: Obstetrics and Gynecology

## 2024-06-20 VITALS — BP 158/105 | Wt 201.0 lb

## 2024-06-20 DIAGNOSIS — N6452 Nipple discharge: Secondary | ICD-10-CM

## 2024-06-20 DIAGNOSIS — R2232 Localized swelling, mass and lump, left upper limb: Secondary | ICD-10-CM

## 2024-06-20 DIAGNOSIS — Z1211 Encounter for screening for malignant neoplasm of colon: Secondary | ICD-10-CM

## 2024-06-20 DIAGNOSIS — Z01419 Encounter for gynecological examination (general) (routine) without abnormal findings: Secondary | ICD-10-CM

## 2024-06-20 DIAGNOSIS — N611 Abscess of the breast and nipple: Secondary | ICD-10-CM

## 2024-06-20 NOTE — Patient Instructions (Signed)
 Explained breast self awareness with Margaret Newman. Pap smear completed today. Let her know that next Pap smear will be due based on the result of today's Pap smear due to her history of abnormal Pap smears. Referred patient to the [facility] for a diagnostic mammogram. Appointment scheduled Thursday, June 20, 2024 at 1245. Patient aware of appointment and will be there. Let patient know will follow up with her within the next couple weeks with results of Pap smear and breast discharge by letter or phone. Margaret Newman verbalized understanding.  Kalan Yeley, Wanda Ship, RN 11:02 AM

## 2024-06-22 ENCOUNTER — Other Ambulatory Visit: Payer: Self-pay

## 2024-06-22 ENCOUNTER — Encounter: Payer: Self-pay | Admitting: Oncology

## 2024-06-22 MED ORDER — CLINDAMYCIN HCL 300 MG PO CAPS
300.0000 mg | ORAL_CAPSULE | Freq: Three times a day (TID) | ORAL | 0 refills | Status: AC
  Filled 2024-06-22: qty 21, 7d supply, fill #0

## 2024-06-24 ENCOUNTER — Ambulatory Visit: Payer: Self-pay | Admitting: *Deleted

## 2024-06-24 ENCOUNTER — Telehealth: Payer: Self-pay

## 2024-06-24 LAB — CYTOLOGY - PAP
Comment: NEGATIVE
Diagnosis: REACTIVE
High risk HPV: NEGATIVE

## 2024-06-24 LAB — CYTOLOGY - NON PAP

## 2024-06-24 NOTE — Telephone Encounter (Signed)
 I have left pt a VM requesting she return my call to review all of her lab results (PAP, breast discharge, imaging instructions). Our contact number and name has been left as well.

## 2024-06-24 NOTE — Telephone Encounter (Signed)
Left message for patient about lab results from [facility]. Left name and number for patient to call back.

## 2024-06-24 NOTE — Progress Notes (Signed)
 See Imaging result for recommendation.

## 2024-06-30 ENCOUNTER — Other Ambulatory Visit: Payer: Self-pay

## 2024-06-30 ENCOUNTER — Emergency Department

## 2024-06-30 ENCOUNTER — Emergency Department
Admission: EM | Admit: 2024-06-30 | Discharge: 2024-06-30 | Disposition: A | Discharge status: Home / Self Care | Attending: Emergency Medicine | Admitting: Emergency Medicine

## 2024-06-30 DIAGNOSIS — N611 Abscess of the breast and nipple: Secondary | ICD-10-CM | POA: Insufficient documentation

## 2024-06-30 LAB — CBC WITH DIFFERENTIAL/PLATELET
Abs Immature Granulocytes: 0.03 K/uL (ref 0.00–0.07)
Basophils Absolute: 0.1 K/uL (ref 0.0–0.1)
Basophils Relative: 1 %
Eosinophils Absolute: 0.3 K/uL (ref 0.0–0.5)
Eosinophils Relative: 4 %
HCT: 39.4 % (ref 36.0–46.0)
Hemoglobin: 13.1 g/dL (ref 12.0–15.0)
Immature Granulocytes: 0 %
Lymphocytes Relative: 25 %
Lymphs Abs: 2.4 K/uL (ref 0.7–4.0)
MCH: 28.7 pg (ref 26.0–34.0)
MCHC: 33.2 g/dL (ref 30.0–36.0)
MCV: 86.4 fL (ref 80.0–100.0)
Monocytes Absolute: 1 K/uL (ref 0.1–1.0)
Monocytes Relative: 10 %
Neutro Abs: 5.9 K/uL (ref 1.7–7.7)
Neutrophils Relative %: 60 %
Platelets: 247 K/uL (ref 150–400)
RBC: 4.56 MIL/uL (ref 3.87–5.11)
RDW: 14.6 % (ref 11.5–15.5)
WBC: 9.7 K/uL (ref 4.0–10.5)
nRBC: 0 % (ref 0.0–0.2)

## 2024-06-30 LAB — BASIC METABOLIC PANEL WITH GFR
Anion gap: 10 (ref 5–15)
BUN: 13 mg/dL (ref 6–20)
CO2: 20 mmol/L — ABNORMAL LOW (ref 22–32)
Calcium: 9.2 mg/dL (ref 8.9–10.3)
Chloride: 108 mmol/L (ref 98–111)
Creatinine, Ser: 0.85 mg/dL (ref 0.44–1.00)
GFR, Estimated: >60 mL/min
Glucose, Bld: 96 mg/dL (ref 70–99)
Potassium: 4.1 mmol/L (ref 3.5–5.1)
Sodium: 137 mmol/L (ref 135–145)

## 2024-06-30 MED ORDER — OXYCODONE HCL 5 MG PO TABS
5.0000 mg | ORAL_TABLET | Freq: Once | ORAL | Status: AC
  Administered 2024-06-30: 5 mg via ORAL
  Filled 2024-06-30: qty 1

## 2024-06-30 MED ORDER — FENTANYL CITRATE (PF) 50 MCG/ML IJ SOSY
50.0000 ug | PREFILLED_SYRINGE | Freq: Once | INTRAMUSCULAR | Status: AC
  Administered 2024-06-30: 50 ug via INTRAVENOUS
  Filled 2024-06-30: qty 1

## 2024-06-30 MED ORDER — VANCOMYCIN HCL 2000 MG/400ML IV SOLN
2000.0000 mg | Freq: Once | INTRAVENOUS | Status: AC
  Administered 2024-06-30: 2000 mg via INTRAVENOUS
  Filled 2024-06-30: qty 400

## 2024-06-30 NOTE — ED Notes (Signed)
 Pt notified staff she is going to the hospital cafeteria and will be back.

## 2024-06-30 NOTE — ED Triage Notes (Signed)
 Pt to ED for R breast abscess, seen here for same on 1/2 and had 10cc of fluid drained same day. Had recent mammogram that was normal. States having redness, swelling and it looks like it's about to pop again. I want them to drain it. Breast is red. Pt is still on the antibiotics.  Has not taken her BP meds yet today.

## 2024-06-30 NOTE — ED Provider Notes (Signed)
 "  [facility] Provider Note    Event Date/Time   First MD Initiated Contact with Patient 06/30/24 1105     (approximate)   History   Breast abscess   HPI  Margaret Newman is a 44 y.o. female who presents with right breast swelling pain.  Review of records demonstrates the patient had IR aspiration on January 2 with Dr. Karalee, had been doing well had follow-up mammogram, symptoms recurred 2 days ago.     Physical Exam   Triage Vital Signs: ED Triage Vitals  Encounter Vitals Group     BP 06/30/24 1008 (!) 196/115     Girls Systolic BP Percentile --      Girls Diastolic BP Percentile --      Boys Systolic BP Percentile --      Boys Diastolic BP Percentile --      Pulse Rate 06/30/24 1008 97     Resp 06/30/24 1008 16     Temp 06/30/24 1008 99.2 F (37.3 C)     Temp Source 06/30/24 1008 Oral     SpO2 06/30/24 1008 93 %     Weight 06/30/24 1011 91 kg (200 lb 9.9 oz)     Height 06/30/24 1011 1.575 m (5' 2)     Head Circumference --      Peak Flow --      Pain Score 06/30/24 1009 10     Pain Loc --      Pain Education --      Exclude from Growth Chart --     Most recent vital signs: Vitals:   06/30/24 1008 06/30/24 1404  BP: (!) 196/115 (!) 188/105  Pulse: 97 90  Resp: 16 16  Temp: 99.2 F (37.3 C)   SpO2: 93% 99%     General: Awake, no distress.  CV:  Good peripheral perfusion.  Resp:  Normal effort.  Abd:  No distention.  Other:  Significant erythema surrounding the right areola circumferentially with fluctuance/coming to a head at 3:00   ED Results / Procedures / Treatments   Labs (all labs ordered are listed, but only abnormal results are displayed) Labs Reviewed  BASIC METABOLIC PANEL WITH GFR - Abnormal; Notable for the following components:      Result Value   CO2 20 (*)    All other components within normal limits  CBC WITH DIFFERENTIAL/PLATELET     EKG     RADIOLOGY Ultrasound breast viewed interpreted  by me, appears to be sizable abscess    PROCEDURES:  Critical Care performed:   Procedures   MEDICATIONS ORDERED IN ED: Medications  oxyCODONE  (Oxy IR/ROXICODONE ) immediate release tablet 5 mg (has no administration in time range)  fentaNYL  (SUBLIMAZE ) injection 50 mcg (50 mcg Intravenous Given 06/30/24 1132)  vancomycin  (VANCOREADY) IVPB 2000 mg/400 mL (2,000 mg Intravenous New Bag/Given 06/30/24 1155)  fentaNYL  (SUBLIMAZE ) injection 50 mcg (50 mcg Intravenous Given 06/30/24 1346)     IMPRESSION / MDM / ASSESSMENT AND PLAN / ED COURSE  I reviewed the triage vital signs and the nursing notes. Patient's presentation is most consistent with acute illness / injury with system symptoms.  Patient presents with breast infection/abscess.  Temperature is 99.2, heart rate is 97, lab work is overall reassuring, will treat with IV fentanyl , IV vancomycin   Discussed with Dr. Jennefer of IR, he requests ultrasound  ----------------------------------------- 2:11 PM on 06/30/2024 ----------------------------------------- Ultrasound demonstrates likely drainable fluid collection, however Dr. Jennefer feels that patient can return tomorrow  to the breast center for aspiration.  Discussed with patient, she is upset by this but will return first thing in the morning      FINAL CLINICAL IMPRESSION(S) / ED DIAGNOSES   Final diagnoses:  Breast abscess     Rx / DC Orders   ED Discharge Orders     None        Note:  This document was prepared using Dragon voice recognition software and may include unintentional dictation errors.   Arlander Charleston, MD 06/30/24 1412  "

## 2024-06-30 NOTE — Progress Notes (Signed)
 ED Pharmacy Antibiotic Sign Off An antibiotic consult was received from an ED provider for Vancomycin  per pharmacy dosing for Cellulitis. A chart review was completed to assess appropriateness.   The following one time order(s) were placed:  Vancomycin  2g IV   Further antibiotic and/or antibiotic pharmacy consults should be ordered by the admitting provider if indicated.   Thank you for allowing pharmacy to be a part of this patient's care.   Estill CHRISTELLA Lutes, PharmD, BCPS Clinical Pharmacist 06/30/2024 11:25 AM

## 2024-06-30 NOTE — ED Notes (Signed)
 Dr Arlander chaperoned by this RN during pts breast exam

## 2024-06-30 NOTE — Discharge Instructions (Signed)
 Dr. Jennefer of interventional radiology has recommend that you come to the breast center tomorrow for aspiration

## 2024-06-30 NOTE — ED Notes (Signed)
 See triage note  Presents with pain and swelling to right breast  Was seen for the same a couple of weeks ago  States had area drained  But this returned couple of days ago

## 2024-07-01 ENCOUNTER — Encounter: Payer: Self-pay | Admitting: Oncology

## 2024-07-01 ENCOUNTER — Emergency Department
Admission: RE | Admit: 2024-07-01 | Discharge: 2024-07-01 | Disposition: A | Discharge status: Home / Self Care | Source: Ambulatory Visit | Attending: Obstetrics and Gynecology | Admitting: Obstetrics and Gynecology

## 2024-07-01 ENCOUNTER — Emergency Department: Admission: EM | Admit: 2024-07-01 | Source: Home / Self Care

## 2024-07-01 ENCOUNTER — Other Ambulatory Visit: Payer: Self-pay | Admitting: Obstetrics and Gynecology

## 2024-07-01 ENCOUNTER — Other Ambulatory Visit: Payer: Self-pay

## 2024-07-01 DIAGNOSIS — N611 Abscess of the breast and nipple: Secondary | ICD-10-CM

## 2024-07-01 MED ORDER — LIDOCAINE HCL 1 % IJ SOLN
5.0000 mL | Freq: Once | INTRAMUSCULAR | Status: DC
  Filled 2024-07-01: qty 5

## 2024-07-01 MED ORDER — SULFAMETHOXAZOLE-TRIMETHOPRIM 800-160 MG PO TABS
1.0000 | ORAL_TABLET | Freq: Two times a day (BID) | ORAL | 0 refills | Status: AC
  Filled 2024-07-01 – 2024-07-03 (×2): qty 20, 10d supply, fill #0

## 2024-07-01 NOTE — ED Provider Notes (Signed)
----------------------------------------- °  11:29 AM on 07/01/2024 ----------------------------------------- Patient presented back to the emergency department as she went to [facility] breast center this morning they were unable to see her.  I reviewed the patient's chart from yesterday I spoke to interventional radiologist Dr. Johann today.  He was able to speak to the breast center and arrange for a 1230 appointment for breast aspiration.  I spoke to the patient and she states she would prefer not to be seen today in the emergency department and would instead follow-up at the breast center at 12:30 PM.   Dorothyann Drivers, MD 07/01/24 1130

## 2024-07-01 NOTE — ED Triage Notes (Deleted)
 Pt reports was seen in ED yesterday and told to follow up with [facility] today. Pt states went to breast center and was told they did not have info on seeing pt today. Pt reports right breast continues to be painful and needs it to be drained. Discussed situation with Dr Dorothyann who is in touch with IR regarding sending pt back to [facility] breast center for procedure.

## 2024-07-02 ENCOUNTER — Inpatient Hospital Stay: Admission: RE | Admit: 2024-07-02 | Source: Ambulatory Visit

## 2024-07-02 ENCOUNTER — Telehealth: Payer: Self-pay

## 2024-07-02 ENCOUNTER — Other Ambulatory Visit

## 2024-07-02 NOTE — Telephone Encounter (Signed)
-----   Message from Chester B sent at 07/02/2024 11:10 AM EST ----- Regarding: RE: Breast abscess She has been scheduled with Dr. Marinda at [facility] 07/09/24 at 11:30a.m. Patient has been advised and is agreeable. ----- Message ----- From: Driscilla Wanda SQUIBB, RN Sent: 07/02/2024   9:46 AM EST To: Bari JONETTA Silversmith; Bridgette Wolden P Aleda Madl, LPN Subject: RE: Breast abscess                             Hello,  I talked with [facility] about her yesterday. She needs a referral to a careers adviser. Please refer her to a careers adviser. She has been advised if she has a new problem to notify us  first before going to the ED. That is more than likely why she called to schedule an appointment.  Thanks, Christine ----- Message ----- From: Rogerio Tempie SQUIBB, LPN Sent: [id number]   8:49 AM EST To: Wanda SQUIBB Driscilla, RN; Bari JONETTA Silversmith Subject: Breast abscess                                 This patient called and stated she went to the [facility] ED on yesterday after the abscess ruptured. Patient was also seen at [facility]. Abx rxed. Patient is requesting a follow-up appointment with [facility]. Does [facility] do f/up appointments?  Thanks,  [employer]

## 2024-07-03 ENCOUNTER — Other Ambulatory Visit: Payer: Self-pay

## 2024-07-03 ENCOUNTER — Encounter: Payer: Self-pay | Admitting: Oncology

## 2024-07-04 ENCOUNTER — Ambulatory Visit: Payer: Self-pay

## 2024-07-04 ENCOUNTER — Other Ambulatory Visit: Payer: Self-pay

## 2024-07-09 ENCOUNTER — Encounter: Payer: Self-pay | Admitting: General Surgery

## 2024-07-09 ENCOUNTER — Ambulatory Visit: Payer: Self-pay | Admitting: General Surgery

## 2024-07-09 VITALS — BP 132/76 | HR 81 | Ht 63.0 in | Wt 198.0 lb

## 2024-07-09 DIAGNOSIS — N611 Abscess of the breast and nipple: Secondary | ICD-10-CM

## 2024-07-09 NOTE — Progress Notes (Signed)
 Patient ID: Margaret Newman, female   DOB: July 15, 1980, 44 y.o.   MRN: [id number] CC: Right Breast Abscess History of Present Illness Margaret Newman is a 44 y.o. female with PMH of HTN who presents in consultation for right breast abscess.  In review of the notes it appears that the patient presented to the emergency department on 2 January with swelling and pain in her right breast.  She then had an aspiration of the breast abscess after it was seen on ultrasound.  She was sent home on antibiotics.  She had an additional aspiration of this on 8 January.  She was originally responsive to clindamycin  but it started to recur.  She presented back to the emergency department on the 18th and had an ultrasound that showed recurrent abscess.  She returned to the cancer center on the 19th and she says between those 2 days it did start to drain purulent fluid.  She had a repeat ultrasound and at that time there was no abscess to be drained as it was already spontaneously draining.  She was placed on Bactrim  and she follows up in clinic today.  She reports that there is an area of scabbing with some thin discharge from it.  She says that she does have some drainage from her nipple.  She says that the pain and the swelling has improved significantly.  She denies any fevers or chills.  She was seen in 2022 for similar problems and underwent a operative incision and drainage with excisional biopsy of an abscess cavity at that time.  She reports in the intervening time between that episode and this episode she had no problems.  She has had a mammogram that does not show any concerns of cancer.  Past Medical History Past Medical History:  Diagnosis Date   Anemia    Asthma    as a child- no inhalers   Bilateral carpal tunnel syndrome 12/17/2020   Carpal tunnel syndrome on right 03/2022   Ear drainage right 12/26/2019   Encounter to establish care 12/26/2019   History of carpal tunnel syndrome 12/26/2019    Hypertension    Iron deficiency anemia    Pre-diabetes    Prediabetes 02/06/2020       Past Surgical History:  Procedure Laterality Date   BREAST BIOPSY Right 08/07/2020   excision for infection per pt   ECTOPIC PREGNANCY SURGERY  2001   EXCISION OF BREAST BIOPSY Right 08/07/2020   Procedure: EXCISION OF BREAST BIOPSY;  Surgeon: Rodolph Romano, MD;  Location: [facility] ORS;  Service: General;  Laterality: Right;   IR US  GUIDE BX ASP/DRAIN  06/14/2024    Allergies[1]  Current Outpatient Medications  Medication Sig Dispense Refill   amLODipine -olmesartan  (AZOR ) 5-20 MG tablet Take 1 tablet by mouth daily. 30 tablet 11   clindamycin  (CLEOCIN ) 150 MG capsule Take 2 capsules (300 mg total) by mouth 3 (three) times daily. 42 capsule 0   clindamycin  (CLEOCIN ) 300 MG capsule Take 1 capsule (300 mg total) by mouth 3 (three) times daily. 21 capsule 0   oxyCODONE  (ROXICODONE ) 5 MG immediate release tablet Take 1 tablet (5 mg total) by mouth every 6 (six) hours as needed for up to 15 doses. 15 tablet 0   sulfamethoxazole -trimethoprim  (BACTRIM  DS) 800-160 MG tablet Take 1 tablet by mouth 2 (two) times daily for 10 days. 20 tablet 0   No current facility-administered medications for this visit.    Family History Family History  Problem Relation Age of Onset  Uterine cancer Paternal Grandmother        Social History Social History[2]  Reports that she is a smoker.   ROS Full ROS of systems performed and is otherwise negative there than what is stated in the HPI  Physical Exam Blood pressure 132/76, pulse 81, height 5' 3 (1.6 m), weight 198 lb (89.8 kg), SpO2 98%.  Alert and oriented x 3, normal, breathing room air, regular rate and rhythm, abdomen soft, nontender nondistended.  As chaperone was utilized to do a breast exam.  On the left breast there is no overlying skin changes.  I was able to express a small amount of whitish discharge from the left nipple.  There is no areas of  fluctuance or induration.  There is no left axillary lymphadenopathy.  On the right breast there is some scaling of the skin.  This is mostly over the medial part of the right breast.  There is an area of scabbing just medial to the nipple at approximately 6:00.  I was unable to express any purulence from this.  There is some induration tissue overlying this and there is some tenderness to palpation of the breast in this area.  There are no areas of fluctuance.  There is a small amount of whitish discharge from the nipple similar to on her left breast.  Data Reviewed I reviewed her mammograms and her ultrasounds.  There was a retroareolar breast mass.  I performed my own ultrasound in the office and there does not look there is an undrained fluid collection.  I have personally reviewed the patient's imaging and medical records.    Assessment    Patient with recurrent right breast abscess that has been aspirated twice.  It appears that it is completely drained as there is no fluid collection on my ultrasound at bedside.  Plan    Discussed with the patient that at this time there is no area that is amenable to incision and drainage or surgical resection.  The induration to the tissue is likely secondary to the recurrent infection.  I recommended that she continue her antibiotics and we will see her again in 3 to 4 weeks to see if the area has healed up.  I did discuss with her that if she does develop an abscess and starts to drain or feels like it has increased swelling with increased pain she can come back to my office for evaluation.  A total of 62 minutes was spent reviewing the patient's chart, performing history and physical and discussing treatment options with the patient  Jayson MALVA Endow 07/09/2024, 2:13 PM     [1] No Known Allergies [2]  Social History Tobacco Use   Smoking status: Every Day    Current packs/day: 0.50    Average packs/day: 0.5 packs/day for 22.0 years (11.0 ttl  pk-yrs)    Types: Cigarettes, E-cigarettes    Passive exposure: Current   Smokeless tobacco: Never  Vaping Use   Vaping status: Former  Substance Use Topics   Alcohol use: Not Currently    Alcohol/week: 2.0 standard drinks of alcohol    Types: 2 Glasses of wine per week   Drug use: Yes    Types: Marijuana    Comment: daily

## 2024-07-09 NOTE — Patient Instructions (Signed)
 Please give our office a call if you have any questions or concerns

## 2024-08-01 ENCOUNTER — Ambulatory Visit: Payer: Self-pay | Admitting: General Surgery
